# Patient Record
Sex: Female | Born: 1960 | Race: White | Hispanic: No | Smoking: Never smoker
Health system: Southern US, Community
[De-identification: ages and names within clinical notes are randomized; demographics above are authoritative.]

## PROBLEM LIST (undated history)

## (undated) DIAGNOSIS — E7800 Pure hypercholesterolemia, unspecified: Secondary | ICD-10-CM

## (undated) DIAGNOSIS — F419 Anxiety disorder, unspecified: Secondary | ICD-10-CM

## (undated) DIAGNOSIS — K219 Gastro-esophageal reflux disease without esophagitis: Secondary | ICD-10-CM

## (undated) DIAGNOSIS — F329 Major depressive disorder, single episode, unspecified: Secondary | ICD-10-CM

## (undated) DIAGNOSIS — K589 Irritable bowel syndrome without diarrhea: Secondary | ICD-10-CM

## (undated) DIAGNOSIS — I10 Essential (primary) hypertension: Secondary | ICD-10-CM

## (undated) DIAGNOSIS — F32A Depression, unspecified: Secondary | ICD-10-CM

## (undated) DIAGNOSIS — E119 Type 2 diabetes mellitus without complications: Secondary | ICD-10-CM

## (undated) HISTORY — PX: SHOULDER SURGERY: SHX246

## (undated) HISTORY — DX: Type 2 diabetes mellitus without complications: E11.9

## (undated) HISTORY — PX: TUBAL LIGATION: SHX77

## (undated) HISTORY — PX: URETHRA SURGERY: SHX824

---

## 2011-08-21 ENCOUNTER — Ambulatory Visit (HOSPITAL_COMMUNITY)
Admission: RE | Admit: 2011-08-21 | Discharge: 2011-08-21 | Disposition: A | Discharge status: Home / Self Care | Payer: BC Managed Care – PPO | Attending: Psychiatry | Admitting: Psychiatry

## 2011-08-21 ENCOUNTER — Encounter (HOSPITAL_COMMUNITY): Payer: Self-pay | Admitting: *Deleted

## 2011-08-21 DIAGNOSIS — K589 Irritable bowel syndrome without diarrhea: Secondary | ICD-10-CM | POA: Insufficient documentation

## 2011-08-21 DIAGNOSIS — F329 Major depressive disorder, single episode, unspecified: Secondary | ICD-10-CM | POA: Insufficient documentation

## 2011-08-21 DIAGNOSIS — I10 Essential (primary) hypertension: Secondary | ICD-10-CM | POA: Insufficient documentation

## 2011-08-21 DIAGNOSIS — E119 Type 2 diabetes mellitus without complications: Secondary | ICD-10-CM | POA: Insufficient documentation

## 2011-08-21 DIAGNOSIS — F3289 Other specified depressive episodes: Secondary | ICD-10-CM | POA: Insufficient documentation

## 2011-08-21 DIAGNOSIS — F17200 Nicotine dependence, unspecified, uncomplicated: Secondary | ICD-10-CM | POA: Insufficient documentation

## 2011-08-21 HISTORY — DX: Depression, unspecified: F32.A

## 2011-08-21 HISTORY — DX: Essential (primary) hypertension: I10

## 2011-08-21 HISTORY — DX: Irritable bowel syndrome, unspecified: K58.9

## 2011-08-21 HISTORY — DX: Anxiety disorder, unspecified: F41.9

## 2011-08-21 HISTORY — DX: Major depressive disorder, single episode, unspecified: F32.9
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
## 2011-08-22 ENCOUNTER — Encounter (HOSPITAL_COMMUNITY): Payer: Self-pay

## 2011-08-22 ENCOUNTER — Other Ambulatory Visit (HOSPITAL_COMMUNITY): Payer: BC Managed Care – PPO | Attending: Psychiatry

## 2011-08-22 DIAGNOSIS — K589 Irritable bowel syndrome without diarrhea: Secondary | ICD-10-CM | POA: Insufficient documentation

## 2011-08-22 DIAGNOSIS — F332 Major depressive disorder, recurrent severe without psychotic features: Secondary | ICD-10-CM | POA: Insufficient documentation

## 2011-08-22 DIAGNOSIS — F419 Anxiety disorder, unspecified: Secondary | ICD-10-CM

## 2011-08-22 DIAGNOSIS — F1010 Alcohol abuse, uncomplicated: Secondary | ICD-10-CM

## 2011-08-22 DIAGNOSIS — E119 Type 2 diabetes mellitus without complications: Secondary | ICD-10-CM | POA: Insufficient documentation

## 2011-08-22 DIAGNOSIS — I10 Essential (primary) hypertension: Secondary | ICD-10-CM | POA: Insufficient documentation

## 2011-08-22 DIAGNOSIS — F329 Major depressive disorder, single episode, unspecified: Secondary | ICD-10-CM
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
## 2011-08-22 NOTE — Progress Notes (Signed)
  Daily Group Progress Note  Program: IOP  Group Time: 0900 - 1030  Participation Level: Active  Behavioral Response: Appropriate, Sharing and Motivated  Type of Therapy:  Psycho-education Group  Summary of Progress:   Missed this part of program as was having initial interview.    Group Time: 1045 - 1210  Participation Level:  Active  Behavioral Response: Appropriate, Sharing and Motivated  Type of Therapy: Process Group  Summary of Progress:   Invited by group to share her story and she did this quite well, actually demonstrating the behavior that she said her son accused her of -- blaming others and not taking own responsibility for her part in the problems.  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  Then went on to blame son, daughter, and husband for her current depression. Interrupted her when she took on the role of therapist to peers and she accepted this well.  Shonna Chock, APRN, CNS [facility]

## 2011-08-23 ENCOUNTER — Other Ambulatory Visit (HOSPITAL_COMMUNITY): Payer: BC Managed Care – PPO

## 2011-08-23 MED ORDER — BUPROPION HCL 100 MG PO TABS: 100.0000 mg | ORAL_TABLET | Freq: Two times a day (BID) | ORAL | Status: AC

## 2011-08-23 NOTE — Progress Notes (Unsigned)
    Daily Group Progress Note  Program: IOP  Group Time: 9:00-10:30 am   Participation Level: Active  Behavioral Response: Appropriate  Type of Therapy:  Process Group  Summary of Progress: She was very talkative today and required redirection to allow others time to share. She expressed concern and confusion over her relationship with her teenage children and how they are distant from her. She lacks insight into how she is smothering them. She states her children are "my whole world" and how sad she is that they are pushing her away. She refers to herself as their "mommy" she also expressed sadness over transitioning from being employed to unemployed as of yesterday on her last day at work. She questions her current identity apart from being a mom and employee.      Group Time: 10:30 am - 12:00 pm   Participation Level:  Active  Behavioral Response: Appropriate  Type of Therapy: Process Group  Summary of Progress: Patient participated in a goodbye ceremony and discussed the importance of having closure when relationships end.   Maxcine Ham, MSW, LCSW

## 2011-08-24 ENCOUNTER — Other Ambulatory Visit (HOSPITAL_COMMUNITY): Payer: BC Managed Care – PPO

## 2011-08-24 NOTE — Progress Notes (Signed)
    Daily Group Progress Note  Program: IOP  Group Time: 9:00-10:30 am   Participation Level: Active  Behavioral Response: Appropriate  Type of Therapy:  Process Group  Summary of Progress: Patient is very talkative about herself and her situations and requires redirection to allow others time to share. She is easily redirected. She expresses a need to "have emotionally attention" from others and identified with a young group member, age 53, today and states she wanted to be her caretaker and asked if she could hug her. She was redirected from this behavior and challenged to see how she has a need to care take for people and receive emotional support from them since she is not getting it from her husband and children. She appears to have some co-dependent traits and is being challenged to find herself through this process. She stated later in group she was considering cutting herself last night in an attempt to get attention from her family. She lacks healthy self coping skills and appears to rely on others to feel good.      Group Time: 10:30 am - 12:00 pm   Participation Level:  Active  Behavioral Response: Appropriate  Type of Therapy: Psycho-education Group  Summary of Progress: Patient was educated on the diagnosis of depression vs anxiety and identified personal symptoms they experience in each one to be able to identify when symptoms return and better understand what they are being treated for.    Maxcine Ham, MSW, LCSW
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
## 2011-08-27 ENCOUNTER — Other Ambulatory Visit (HOSPITAL_COMMUNITY): Payer: BC Managed Care – PPO

## 2011-08-27 NOTE — Progress Notes (Signed)
    Daily Group Progress Note  Program: IOP  Group Time: 9:00-10:30 am   Participation Level: Active  Behavioral Response: Passive-Aggressive, Resistant, Disruptive, Blaming, Attention-Seeking, Monopolizing, Agitated and Evasive  Type of Therapy:  Process Group  Summary of Progress: Patient engaged in several negative and disruptive and unsupportive behaviors during the group. She left the room while making several loud distractions during a time of quiet medication and stated "I just wanted to step out". She made racist comments about the entire population of "cubans" and did not respond well when asked to speak only about herself and her husband instead of attacking an entire culture. She was defensive when members challenged her views and then became angry when she was redirected to allow others time to share. She talks of wanting to change her husband and her children to make them like her more, but lacks insight into how she needs to be the one to change. She does not engage with the other group members and while they talk and bond, she goes out into the hallway and takes personal calls. All the group members expressed "fear" of her and stated they don't feel comfortable with her in the group when patient left the room for break.      Group Time: 10:30 am - 12:00 pm   Participation Level:  Active  Behavioral Response: Rigid, Passive-Aggressive, Disruptive, Attention-Seeking, Monopolizing, Agitated and Intrusive  Type of Therapy: Psycho-education Group  Summary of Progress: Patient listened to an education segment on how to be assertive and gave examples of how she uses the aggressive style of communication in many interactions with others. She appeared to not find the topic helpful and stated she just "wants to talk about her own situation for the whole time" (her words). Writer met privately immediately following group with patient to express concerns about behaviors presented in  the group. Patient did not hear writers content and interjected feelings that were not being discusses. Patient stated "you just don't like me" when writer was trying to get patient to engage more with the group. Patient then stated "so its all my fault". Writer is observing some traits of Borderline personality within behaviors observed by patient so far in the group setting.   Maxcine Ham, MSW, LCSW

## 2011-08-28 ENCOUNTER — Other Ambulatory Visit (HOSPITAL_COMMUNITY): Payer: BC Managed Care – PPO

## 2011-08-29 ENCOUNTER — Other Ambulatory Visit (HOSPITAL_COMMUNITY): Payer: BC Managed Care – PPO

## 2011-08-29 NOTE — Progress Notes (Unsigned)
    Daily Group Progress Note  Program: IOP  Group Time: 9:00-10:30 am   Participation Level: Active  Behavioral Response: Appropriate  Type of Therapy:  Process Group  Summary of Progress: Patient presented completley different today and was open and receptive to being a member of the group, listening and accepting feedback. She really listened and summarized what she heard others saying and stated she is learning more about herself than she has ever learned before about how she is getting in the way of having heathy relationships with others. She has a lot of negative judgements that she places on others that required some redirection, but she was easily redirected. She would benefit from learning how to use the DBT skill of WISE mind to help her reduce her negative judgments of others.      Group Time: 10:30 am - 12:00 pm   Participation Level:  Active  Behavioral Response: Appropriate  Type of Therapy: Psycho-education Group  Summary of Progress: Patient participated in a continued education segment on Boundary setting and learned the five ways to implement boundaries with others.   10:30 am - 12:00 pm

## 2011-08-29 NOTE — Progress Notes (Signed)
    Daily Group Progress Note  Program: IOP  Group Time: 9:00-10:30 am   Participation Level: Active  Behavioral Response: Resistant, Attention-Seeking, Monopolizing, Agitated and Evasive  Type of Therapy:  Process Group  Summary of Progress: Patient left the room abruptly and with distraction for a second day in a row during the five minutes of quiet meditation. She continued to set herself apart from the group and members appears to be getting frustrated with her behavior. Members intervened by giving patient feedback on how her negative behaviors and tendency not to listen to them and make everything about her was pushing them away from her. Patient became defensive and was not hearing the real content of what was being shared with her. She was inserting her own feelings, assumptions and thoughts into the feedback others were giving her. It took almost forty five minutes of doing this direct intervention before patient was able to really  Listen and hear the concerns of each group member, but she was able to and spent the rest of the group practicing "good listening skills".       Group Time: 10:30 am - 12:00 pm   Participation Level:  Active  Behavioral Response: Appropriate  Type of Therapy: Psycho-education Group  Summary of Progress:   Patient participated in an education segment on defining healthy boundaries, barriers to setting healthy limits and how different personality style impact boundary setting.    Maxcine Ham, MSW, LCSW

## 2011-08-30 ENCOUNTER — Other Ambulatory Visit (HOSPITAL_COMMUNITY): Payer: BC Managed Care – PPO

## 2011-08-30 NOTE — Progress Notes (Signed)
    Daily Group Progress Note  Program: IOP  Group Time: 9:00-10:30 am   Participation Level: Active  Behavioral Response: Appropriate  Type of Therapy:  Process Group  Summary of Progress: Patient appeared more calm and in control of herself today and group members gave her this feedback. She expressed sadness over her teenage children not treating her with more respect, needing her as "mommy" and getting bad grades. She was challenged on how they are acting in an age appropriate way and her need to control them is not working for her and increasing her depression. She is working on accepting her situation. She also is expressing frustration with her husband and her marriage, but feels helpless to leave. She is forming with the group and responds very well to direct redirection.      Group Time: 10:30 am - 12:00 pm   Participation Level:  Active  Behavioral Response: Appropriate  Type of Therapy: Psycho-education Group  Summary of Progress: Patient participated in an education segment on self esteem and rated their current level of self esteem and identified events from childhood that impacted self-esteem level, unhealthy rules and behaviors associated with low self-esteem and ways to increase positive self image.    Maxcine Ham, MSW, LCSW

## 2011-08-30 NOTE — Patient Instructions (Signed)
Patient will complete MH-IOP tomorrow.  Follow up with Dr. Jeannine Kitten on 09-04-11 and Arloa Koh, [facility] the following week.

## 2011-08-31 ENCOUNTER — Other Ambulatory Visit (HOSPITAL_COMMUNITY): Payer: BC Managed Care – PPO

## 2011-08-31 ENCOUNTER — Other Ambulatory Visit (HOSPITAL_COMMUNITY): Payer: Self-pay | Admitting: Psychiatry

## 2011-08-31 ENCOUNTER — Ambulatory Visit (HOSPITAL_COMMUNITY)
Admission: AD | Admit: 2011-08-31 | Discharge: 2011-08-31 | Disposition: A | Discharge status: Home / Self Care | Payer: BC Managed Care – PPO | Source: Ambulatory Visit | Attending: Psychiatry | Admitting: Psychiatry

## 2011-08-31 DIAGNOSIS — E119 Type 2 diabetes mellitus without complications: Secondary | ICD-10-CM | POA: Insufficient documentation

## 2011-08-31 DIAGNOSIS — F603 Borderline personality disorder: Secondary | ICD-10-CM | POA: Insufficient documentation

## 2011-08-31 DIAGNOSIS — F329 Major depressive disorder, single episode, unspecified: Secondary | ICD-10-CM | POA: Insufficient documentation

## 2011-08-31 DIAGNOSIS — F17200 Nicotine dependence, unspecified, uncomplicated: Secondary | ICD-10-CM | POA: Insufficient documentation

## 2011-08-31 DIAGNOSIS — F3289 Other specified depressive episodes: Secondary | ICD-10-CM | POA: Insufficient documentation

## 2011-08-31 DIAGNOSIS — K589 Irritable bowel syndrome without diarrhea: Secondary | ICD-10-CM | POA: Insufficient documentation

## 2011-08-31 DIAGNOSIS — I10 Essential (primary) hypertension: Secondary | ICD-10-CM | POA: Insufficient documentation

## 2011-08-31 NOTE — Progress Notes (Unsigned)
    Daily Group Progress Note  Program: IOP  Group Time: 9:00-10:30 am   Participation Level: Active  Behavioral Response: Resistant, Disruptive, Monopolizing, Agitated and Intrusive  Type of Therapy:  Process Group  Summary of Progress: Patient resorted back to unhealthy, negative, attention seeking behaviors and was disruptive to the entire group process. She took personal cell phone calls during group, did not participate during the relaxation exercise and informed the discharging psychiatrist that yesterday she took four Klonopin, which was over her prescribed dose yesterday, because she "could not handle things"  She made vague suicidal comments to the doctor and during group. She was not listening to other group members and wanted to only talk about herself. She lacked insight into how she was engaging in negative behaviors. She said she felt she needed to go inpatient due to not being able to handle family stressors. Every group member expressed their fear for patient and agreed she needed to go into the hospital. Writer informed the attending psychiatrist, Dr. Lolly Mustache, of her agreement for voluntary inpatient admission and then walked patient upstairs and put her in the locked assessment room where she waited for the intake counselor. Writer was then informed that patient lied to hospital staff and told them she was a visitor and had then let her outside where she left without informing anyone she was leaving. She later called writer to inform her she was ok and went to get "a message". Writer left her a message informing her of her completed discharge and notified her to contact her therapist for continued treatment. It is writers recommendation that patient would benefit from continued DBT therapy to address unhealthy behaviors impacting her strained family relationships as this played out daily within the group process.     Group Time: 10:30 am - 12:00 pm   Participation Level:   None  Behavioral Response: None  Type of Therapy: None  Summary of Progress: see above note  Maxcine Ham, MSW, LCSW
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
## 2011-08-31 NOTE — Progress Notes (Unsigned)
Patient came today to attend her intensive outpatient program.  She was supposed to be discharged from the program today however patient endorsed increased anxiety and depression.  She has difficulty falling asleep last night and she took for Klonopin and despite that she is unable to sleep.  Patient endorse significant stress at home.  She had argument with her son.  She does not feel safe going back to her home.  Her husband is out of the town.  She admitted having strong suicidal thoughts but denies any plan however she also does not feel safe at home.  She denies any paranoia or any hallucination but admitted feeling of hopelessness helplessness and anhedonia.  Patient was offered voluntary inpatient admission.  Patient agreed with the plan.  Case discussed with the staff of intensive outpatient program and patient was escorted to assessment.

## 2011-09-03 ENCOUNTER — Other Ambulatory Visit (HOSPITAL_COMMUNITY): Payer: BC Managed Care – PPO

## 2011-09-04 ENCOUNTER — Other Ambulatory Visit (HOSPITAL_COMMUNITY): Payer: BC Managed Care – PPO

## 2011-09-05 ENCOUNTER — Other Ambulatory Visit (HOSPITAL_COMMUNITY): Payer: BC Managed Care – PPO

## 2011-09-06 ENCOUNTER — Other Ambulatory Visit (HOSPITAL_COMMUNITY): Payer: BC Managed Care – PPO

## 2011-09-07 ENCOUNTER — Other Ambulatory Visit (HOSPITAL_COMMUNITY): Payer: BC Managed Care – PPO

## 2011-09-10 ENCOUNTER — Other Ambulatory Visit (HOSPITAL_COMMUNITY): Payer: BC Managed Care – PPO

## 2011-09-11 ENCOUNTER — Other Ambulatory Visit (HOSPITAL_COMMUNITY): Payer: BC Managed Care – PPO

## 2011-09-12 ENCOUNTER — Other Ambulatory Visit (HOSPITAL_COMMUNITY): Payer: BC Managed Care – PPO

## 2011-09-13 ENCOUNTER — Other Ambulatory Visit (HOSPITAL_COMMUNITY): Payer: BC Managed Care – PPO

## 2011-09-14 ENCOUNTER — Other Ambulatory Visit (HOSPITAL_COMMUNITY): Payer: BC Managed Care – PPO

## 2011-09-18 ENCOUNTER — Other Ambulatory Visit (HOSPITAL_COMMUNITY): Payer: BC Managed Care – PPO

## 2011-09-19 ENCOUNTER — Other Ambulatory Visit (HOSPITAL_COMMUNITY): Payer: BC Managed Care – PPO

## 2014-04-29 ENCOUNTER — Encounter: Payer: Self-pay | Admitting: Podiatry

## 2014-04-29 ENCOUNTER — Ambulatory Visit: Payer: Self-pay | Admitting: Podiatry

## 2014-04-29 ENCOUNTER — Ambulatory Visit (INDEPENDENT_AMBULATORY_CARE_PROVIDER_SITE_OTHER): Payer: 59 | Admitting: Podiatry

## 2014-04-29 ENCOUNTER — Ambulatory Visit (INDEPENDENT_AMBULATORY_CARE_PROVIDER_SITE_OTHER): Payer: 59

## 2014-04-29 DIAGNOSIS — S92301A Fracture of unspecified metatarsal bone(s), right foot, initial encounter for closed fracture: Secondary | ICD-10-CM

## 2014-04-29 DIAGNOSIS — M779 Enthesopathy, unspecified: Secondary | ICD-10-CM

## 2014-04-29 MED ORDER — TRIAMCINOLONE ACETONIDE 10 MG/ML IJ SUSP
10.0000 mg | Freq: Once | INTRAMUSCULAR | Status: AC
  Administered 2014-04-29: 10 mg
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
## 2014-05-03 ENCOUNTER — Telehealth: Payer: Self-pay | Admitting: *Deleted
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
## 2014-05-05 ENCOUNTER — Telehealth: Payer: Self-pay

## 2014-05-05 ENCOUNTER — Telehealth: Payer: Self-pay | Admitting: *Deleted

## 2014-05-05 NOTE — Telephone Encounter (Signed)
Pt called stating that she needed her diagnosis explained to her further. I returned her call and left a voice mail for her to call the office.

## 2014-05-05 NOTE — Telephone Encounter (Signed)
Entered in error
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
## 2014-05-20 ENCOUNTER — Encounter: Payer: Self-pay | Admitting: Podiatry

## 2014-05-20 ENCOUNTER — Ambulatory Visit (INDEPENDENT_AMBULATORY_CARE_PROVIDER_SITE_OTHER): Payer: 59 | Admitting: Podiatry

## 2014-05-20 DIAGNOSIS — S92301A Fracture of unspecified metatarsal bone(s), right foot, initial encounter for closed fracture: Secondary | ICD-10-CM

## 2014-05-20 DIAGNOSIS — M779 Enthesopathy, unspecified: Secondary | ICD-10-CM
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
## 2014-10-28 ENCOUNTER — Emergency Department (HOSPITAL_COMMUNITY)
Admission: EM | Admit: 2014-10-28 | Discharge: 2014-10-28 | Disposition: A | Discharge status: Home / Self Care | Payer: 59 | Attending: Emergency Medicine | Admitting: Emergency Medicine

## 2014-10-28 ENCOUNTER — Encounter (HOSPITAL_COMMUNITY): Payer: Self-pay | Admitting: Emergency Medicine

## 2014-10-28 DIAGNOSIS — S161XXA Strain of muscle, fascia and tendon at neck level, initial encounter: Secondary | ICD-10-CM

## 2014-10-28 DIAGNOSIS — Y92410 Unspecified street and highway as the place of occurrence of the external cause: Secondary | ICD-10-CM | POA: Diagnosis not present

## 2014-10-28 DIAGNOSIS — R0602 Shortness of breath: Secondary | ICD-10-CM | POA: Insufficient documentation

## 2014-10-28 DIAGNOSIS — Z79899 Other long term (current) drug therapy: Secondary | ICD-10-CM | POA: Diagnosis not present

## 2014-10-28 DIAGNOSIS — Y999 Unspecified external cause status: Secondary | ICD-10-CM | POA: Diagnosis not present

## 2014-10-28 DIAGNOSIS — I10 Essential (primary) hypertension: Secondary | ICD-10-CM | POA: Insufficient documentation

## 2014-10-28 DIAGNOSIS — Z791 Long term (current) use of non-steroidal anti-inflammatories (NSAID): Secondary | ICD-10-CM | POA: Diagnosis not present

## 2014-10-28 DIAGNOSIS — E119 Type 2 diabetes mellitus without complications: Secondary | ICD-10-CM | POA: Diagnosis not present

## 2014-10-28 DIAGNOSIS — Z8719 Personal history of other diseases of the digestive system: Secondary | ICD-10-CM | POA: Diagnosis not present

## 2014-10-28 DIAGNOSIS — F329 Major depressive disorder, single episode, unspecified: Secondary | ICD-10-CM | POA: Insufficient documentation

## 2014-10-28 DIAGNOSIS — F419 Anxiety disorder, unspecified: Secondary | ICD-10-CM | POA: Insufficient documentation

## 2014-10-28 DIAGNOSIS — S199XXA Unspecified injury of neck, initial encounter: Secondary | ICD-10-CM | POA: Diagnosis present

## 2014-10-28 DIAGNOSIS — Y9389 Activity, other specified: Secondary | ICD-10-CM | POA: Insufficient documentation
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
## 2014-10-28 NOTE — ED Notes (Signed)
MD at bedside. 
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
## 2014-10-28 NOTE — ED Notes (Signed)
Patient repeatedly asking to see a priest. Patient was offered a Orthoptistchaplain, she states she wants a priest but will speak to a chaplain. PA entered patient room, patient states to the PA "you will have to come back I am trying to get a hold of my priest."   Patient ambulatory to restroom unassisted. Patient with bottle of water in the bed, patient was previously advised, and again reminded she is not to eat or drink while waiting for a physician and testing to be completed.
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
## 2017-02-08 ENCOUNTER — Other Ambulatory Visit (HOSPITAL_BASED_OUTPATIENT_CLINIC_OR_DEPARTMENT_OTHER): Payer: Self-pay | Admitting: Family Medicine

## 2017-02-08 DIAGNOSIS — Z1231 Encounter for screening mammogram for malignant neoplasm of breast: Secondary | ICD-10-CM

## 2017-02-25 ENCOUNTER — Encounter (HOSPITAL_BASED_OUTPATIENT_CLINIC_OR_DEPARTMENT_OTHER): Payer: Self-pay

## 2017-02-25 ENCOUNTER — Ambulatory Visit (HOSPITAL_BASED_OUTPATIENT_CLINIC_OR_DEPARTMENT_OTHER)
Admission: RE | Admit: 2017-02-25 | Discharge: 2017-02-25 | Disposition: A | Discharge status: Home / Self Care | Payer: Medicare Other | Source: Ambulatory Visit | Attending: Family Medicine | Admitting: Family Medicine

## 2017-02-25 DIAGNOSIS — Z1231 Encounter for screening mammogram for malignant neoplasm of breast: Secondary | ICD-10-CM | POA: Diagnosis present

## 2018-01-14 ENCOUNTER — Other Ambulatory Visit: Payer: Self-pay

## 2018-01-14 ENCOUNTER — Emergency Department (HOSPITAL_BASED_OUTPATIENT_CLINIC_OR_DEPARTMENT_OTHER)
Admission: EM | Admit: 2018-01-14 | Discharge: 2018-01-15 | Disposition: A | Discharge status: Home / Self Care | Payer: Medicare HMO | Attending: Emergency Medicine | Admitting: Emergency Medicine

## 2018-01-14 ENCOUNTER — Encounter (HOSPITAL_BASED_OUTPATIENT_CLINIC_OR_DEPARTMENT_OTHER): Payer: Self-pay | Admitting: *Deleted

## 2018-01-14 DIAGNOSIS — I10 Essential (primary) hypertension: Secondary | ICD-10-CM | POA: Insufficient documentation

## 2018-01-14 DIAGNOSIS — Z7984 Long term (current) use of oral hypoglycemic drugs: Secondary | ICD-10-CM | POA: Insufficient documentation

## 2018-01-14 DIAGNOSIS — E119 Type 2 diabetes mellitus without complications: Secondary | ICD-10-CM | POA: Insufficient documentation

## 2018-01-14 DIAGNOSIS — R799 Abnormal finding of blood chemistry, unspecified: Secondary | ICD-10-CM | POA: Diagnosis present

## 2018-01-14 DIAGNOSIS — Z79899 Other long term (current) drug therapy: Secondary | ICD-10-CM | POA: Diagnosis not present

## 2018-01-14 DIAGNOSIS — N179 Acute kidney failure, unspecified: Secondary | ICD-10-CM | POA: Diagnosis not present

## 2018-01-14 LAB — URINALYSIS, ROUTINE W REFLEX MICROSCOPIC
BILIRUBIN URINE: NEGATIVE
Glucose, UA: NEGATIVE mg/dL
HGB URINE DIPSTICK: NEGATIVE
KETONES UR: NEGATIVE mg/dL
LEUKOCYTES UA: NEGATIVE
Nitrite: NEGATIVE
PH: 5.5 (ref 5.0–8.0)
PROTEIN: NEGATIVE mg/dL
Specific Gravity, Urine: 1.01 (ref 1.005–1.030)

## 2018-01-14 LAB — COMPREHENSIVE METABOLIC PANEL
ALBUMIN: 3.3 g/dL — AB (ref 3.5–5.0)
ALK PHOS: 61 U/L (ref 38–126)
ALT: 19 U/L (ref 0–44)
ANION GAP: 11 (ref 5–15)
AST: 20 U/L (ref 15–41)
BILIRUBIN TOTAL: 0.5 mg/dL (ref 0.3–1.2)
BUN: 18 mg/dL (ref 6–20)
CO2: 21 mmol/L — AB (ref 22–32)
CREATININE: 1.2 mg/dL — AB (ref 0.44–1.00)
Calcium: 8.6 mg/dL — ABNORMAL LOW (ref 8.9–10.3)
Chloride: 107 mmol/L (ref 98–111)
GFR calc Af Amer: 56 mL/min — ABNORMAL LOW (ref >60)
GFR calc non Af Amer: 48 mL/min — ABNORMAL LOW (ref >60)
Glucose, Bld: 121 mg/dL — ABNORMAL HIGH (ref 70–99)
Potassium: 3.9 mmol/L (ref 3.5–5.1)
SODIUM: 139 mmol/L (ref 135–145)
TOTAL PROTEIN: 6 g/dL — AB (ref 6.5–8.1)

## 2018-01-14 MED ORDER — SODIUM CHLORIDE 0.9 % IV BOLUS
1000.0000 mL | Freq: Once | INTRAVENOUS | Status: AC
  Administered 2018-01-14: 1000 mL via INTRAVENOUS

## 2018-01-14 NOTE — ED Triage Notes (Signed)
Her MD called her and told her she had abnormal lab results from blood drawn today. Increased BM's.

## 2018-01-14 NOTE — ED Provider Notes (Signed)
[facility] EMERGENCY DEPARTMENT Provider Note   CSN: [id number] Arrival date & time: 01/14/18  2241     History   Chief Complaint Chief Complaint  Patient presents with  . Abnormal Lab    HPI Alicia Callahan is a 57 y.o. female.  The history is provided by the patient.  She has history of diabetes, hypertension, anxiety, depression and was told to come into the ED because of an abnormal blood test.  She had diarrhea 2 days ago which has been mainly improved.  She had one loose bowel movement yesterday and one loose bowel movement today.  She was having generalized malaise and went to see her physician today.  Physician drew some blood work and found that her kidney function had gotten significantly worse and was told to come to the ED.  She denies fever or chills.  She has been taking ibuprofen and Goody powder.  She is also taking lisinopril for protection of her kidneys because of diabetes.  Past Medical History:  Diagnosis Date  . Anxiety   . Depression   . Diabetes mellitus   . Diabetes mellitus type II   . Hypertension   . Irritable bowel syndrome     There are no active problems to display for this patient.   Past Surgical History:  Procedure Laterality Date  . SHOULDER SURGERY Right   . TUBAL LIGATION       OB History   None      Home Medications    Prior to Admission medications   Medication Sig Start Date End Date Taking? Authorizing Provider  buPROPion (WELLBUTRIN) 100 MG tablet Take 1 tablet (100 mg total) by mouth 2 (two) times daily. Patient taking differently: Take 100 mg by mouth daily.  08/23/11 01/14/18 Yes Gayland Curry, MD  clonazePAM (KLONOPIN) 1 MG tablet Take 1 mg by mouth 2 (two) times daily as needed for anxiety.    Yes [provider]  lisinopril (PRINIVIL,ZESTRIL) 5 MG tablet Take 5 mg by mouth daily. 10/04/14 01/14/18 Yes [provider]  metFORMIN (GLUCOPHAGE) 500 MG tablet Take 1 tablet by mouth 2 (two)  times daily. 06/21/14  Yes [provider]  simvastatin (ZOCOR) 10 MG tablet Take 10 mg by mouth at bedtime. 10/06/14 01/14/18 Yes [provider]  vitamin E 100 UNIT capsule Take 100 Units by mouth daily.   Yes [provider]  zolpidem (AMBIEN) 5 MG tablet Take 5 mg by mouth at bedtime as needed for sleep.    Yes [provider]  Acetaminophen-Caffeine (TENSION HEADACHE) 500-65 MG TABS Take 2 tablets by mouth every 4 (four) hours as needed (pain).    [provider]  Escitalopram Oxalate (LEXAPRO PO) Take 20 mg by mouth daily.     [provider]  gabapentin (NEURONTIN) 100 MG capsule Take 1-3 capsules by mouth at bedtime as needed. pain 08/02/14   [provider]  Ginkgo Biloba Extract (GNP GINGKO BILOBA EXTRACT) 60 MG CAPS Take 1 capsule by mouth daily.    [provider]  HYDROcodone-acetaminophen (NORCO/VICODIN) 5-325 MG per tablet Take 1 tablet by mouth every 8 (eight) hours as needed. pain 10/26/14   [provider]  hyoscyamine (ANASPAZ) 0.125 MG TBDP Place 0.125 mg under the tongue.    [provider]  metoprolol succinate (TOPROL-XL) 50 MG 24 hr tablet Take 1 tablet by mouth every morning. 10/18/14   [provider]  pantoprazole (PROTONIX) 40 MG tablet Take 40 mg  by mouth daily. 10/04/14 10/04/15  [provider]    Family History Family History  Problem Relation Age of Onset  . Depression Mother   . Alcohol abuse Father   . Alcohol abuse Brother   . Drug abuse Brother   . Depression Maternal Grandmother     Social History Social History   Tobacco Use  . Smoking status: Never Smoker  . Smokeless tobacco: Never Used  Substance Use Topics  . Alcohol use: Yes    Alcohol/week: 28.0 standard drinks    Types: 14 Glasses of wine, 14 Cans of beer per week    Comment: reports last drink 10/25/2014  . Drug use: No     Allergies   Sulphadimidine [sulfamethazine] and Sulphur  [sulfur]   Review of Systems Review of Systems  All other systems reviewed and are negative.    Physical Exam Updated Vital Signs BP 108/66 (BP Location: Right Arm)   Pulse 82   Temp 98.4 F (36.9 C) (Oral)   Resp 16   Ht 5\' 3"  (1.6 m)   Wt 64.4 kg   SpO2 98%   BMI 25.15 kg/m   Physical Exam  Nursing note and vitals reviewed.  57 year old female, resting comfortably and in no acute distress. Vital signs are normal. Oxygen saturation is 98%, which is normal. Head is normocephalic and atraumatic. PERRLA, EOMI. Oropharynx is clear. Neck is nontender and supple without adenopathy or JVD. Back is nontender and there is no CVA tenderness. Lungs are clear without rales, wheezes, or rhonchi. Chest is nontender. Heart has regular rate and rhythm without murmur. Abdomen is soft, flat, nontender without masses or hepatosplenomegaly and peristalsis is normoactive. Extremities have no cyanosis or edema, full range of motion is present. Skin is warm and dry without rash. Neurologic: Mental status is normal, cranial nerves are intact, there are no motor or sensory deficits.  ED Treatments / Results  Labs (all labs ordered are listed, but only abnormal results are displayed) Labs Reviewed  COMPREHENSIVE METABOLIC PANEL - Abnormal; Notable for the following components:      Result Value   CO2 21 (*)    Glucose, Bld 121 (*)    Creatinine, Ser 1.20 (*)    Calcium 8.6 (*)    Total Protein 6.0 (*)    Albumin 3.3 (*)    GFR calc non Af Amer 48 (*)    GFR calc Af Amer 56 (*)    All other components within normal limits  URINALYSIS, ROUTINE W REFLEX MICROSCOPIC  CBC WITH DIFFERENTIAL/PLATELET   Procedures Procedures   Medications Ordered in ED Medications  sodium chloride 0.9 % bolus 1,000 mL (1,000 mLs Intravenous New Bag/Given 01/14/18 2326)     Initial Impression / Assessment and Plan / ED Course  I have reviewed the triage vital signs and the nursing notes.  Pertinent  lab results that were available during my care of the patient were reviewed by me and considered in my medical decision making (see chart for details).  Elevated creatinine level.  Old records are reviewed, and labs drawn at her physician's office showed creatinine 1.56 with BUN 19, estimated GFR 36.  In April, creatinine had been 0.77 with estimated GFR 85, BUN 19.  I suspect that patient had some relative dehydration secondary to her diarrhea.  She also has been on NSAIDs which can cause renal failure.  She is also taking an ACE inhibitor which can cause renal failure.  Today, urinalysis shows normal  specific gravity.  Metabolic panel here shows creatinine 1.20, BUN 18, estimated GFR 56.  She is given an intravenous bolus of normal saline and is discharged with instructions to avoid all NSAIDs, stop lisinopril, have creatinine rechecked in 1 week through her primary care physician's office.  Final Clinical Impressions(s) / ED Diagnoses   Final diagnoses:  Acute kidney injury (nontraumatic) [facility])    ED Discharge Orders    None       Dione Booze, MD 01/15/18 0011

## 2018-01-15 LAB — CBC WITH DIFFERENTIAL/PLATELET
Abs Immature Granulocytes: 0 10*3/uL (ref 0.0–0.1)
BASOS PCT: 1 %
Basophils Absolute: 0.1 10*3/uL (ref 0.0–0.1)
Eosinophils Absolute: 0.8 10*3/uL — ABNORMAL HIGH (ref 0.0–0.7)
Eosinophils Relative: 9 %
HCT: 39.1 % (ref 36.0–46.0)
HEMOGLOBIN: 12.8 g/dL (ref 12.0–15.0)
Immature Granulocytes: 0 %
LYMPHS PCT: 39 %
Lymphs Abs: 3.7 10*3/uL (ref 0.7–4.0)
MCH: 31.1 pg (ref 26.0–34.0)
MCHC: 32.7 g/dL (ref 30.0–36.0)
MCV: 95.1 fL (ref 78.0–100.0)
Monocytes Absolute: 0.8 10*3/uL (ref 0.1–1.0)
Monocytes Relative: 8 %
NEUTROS ABS: 4.1 10*3/uL (ref 1.7–7.7)
Neutrophils Relative %: 43 %
Platelets: 298 10*3/uL (ref 150–400)
RBC: 4.11 MIL/uL (ref 3.87–5.11)
RDW: 12.1 % (ref 11.5–15.5)
WBC: 9.5 10*3/uL (ref 4.0–10.5)

## 2018-01-15 NOTE — Discharge Instructions (Addendum)
Drink plenty of fluids.  Do not take ibuprofen, naproxen, BC Powder, or anything that contains aspirin.  Stop taking lisinopril. Once your kidneys get back to normal, your doctor may choose to restart this.

## 2018-01-29 IMAGING — MG 2D DIGITAL SCREENING BILATERAL MAMMOGRAM WITH CAD AND ADJUNCT TO
9 series · 9 of 25 positions shown · non-contrast
Comparison: Previous exam(s).

CLINICAL DATA: Screening.

EXAM:
2D DIGITAL SCREENING BILATERAL MAMMOGRAM WITH CAD AND ADJUNCT TOMO

[R XCCL]
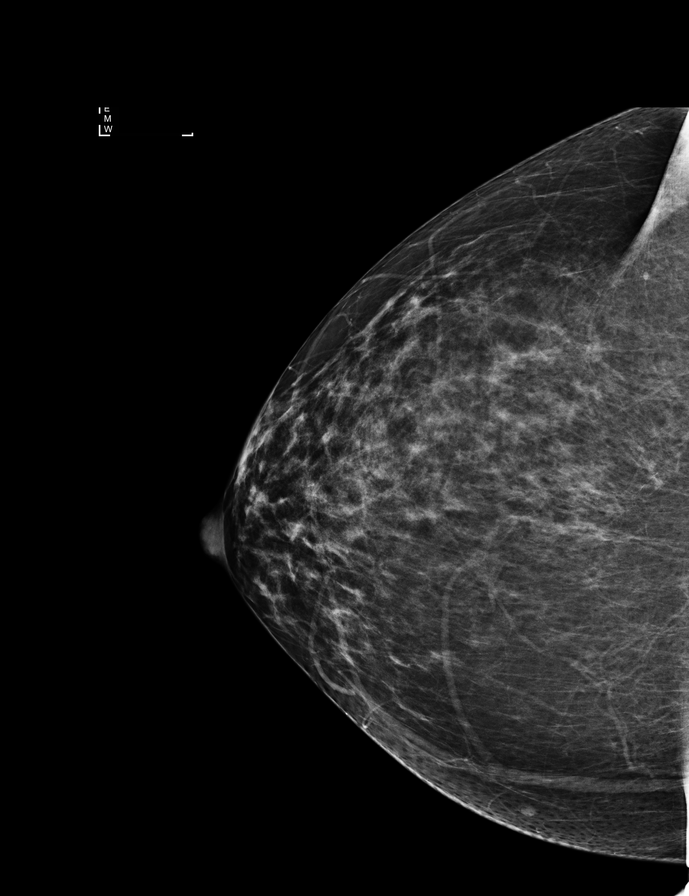

[R CC]
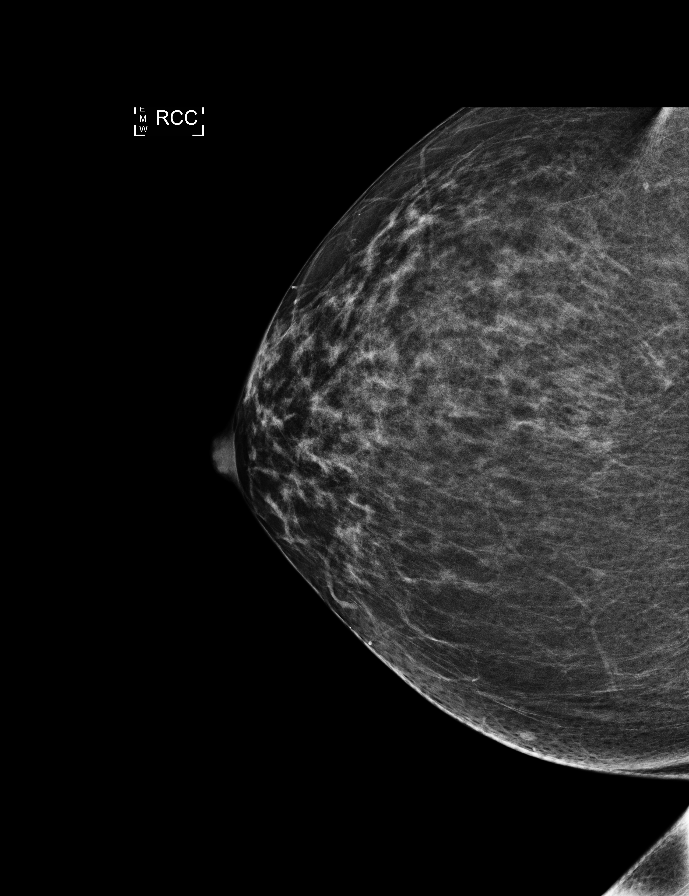

[L CC]
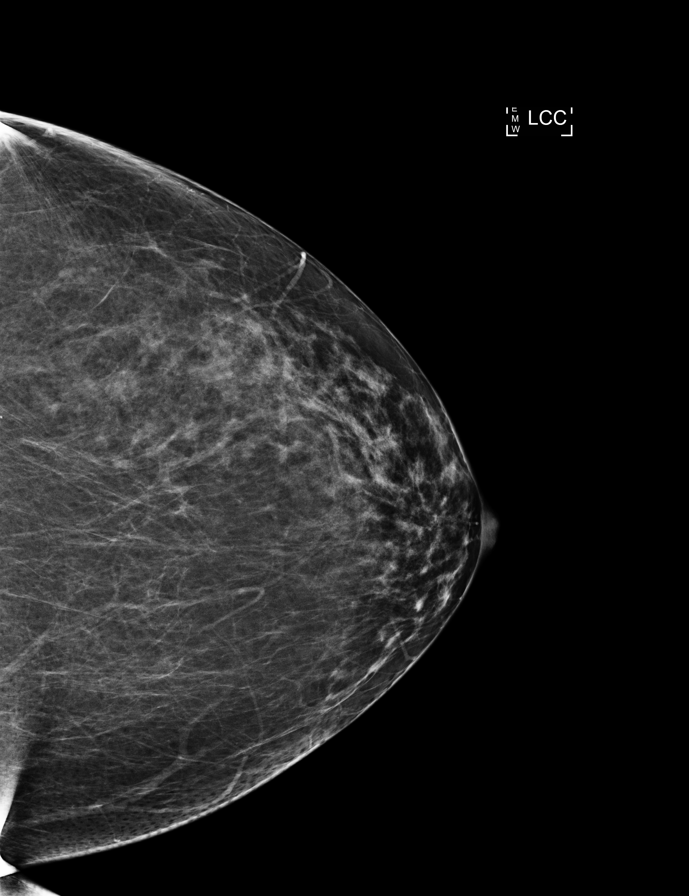

[R MLO]
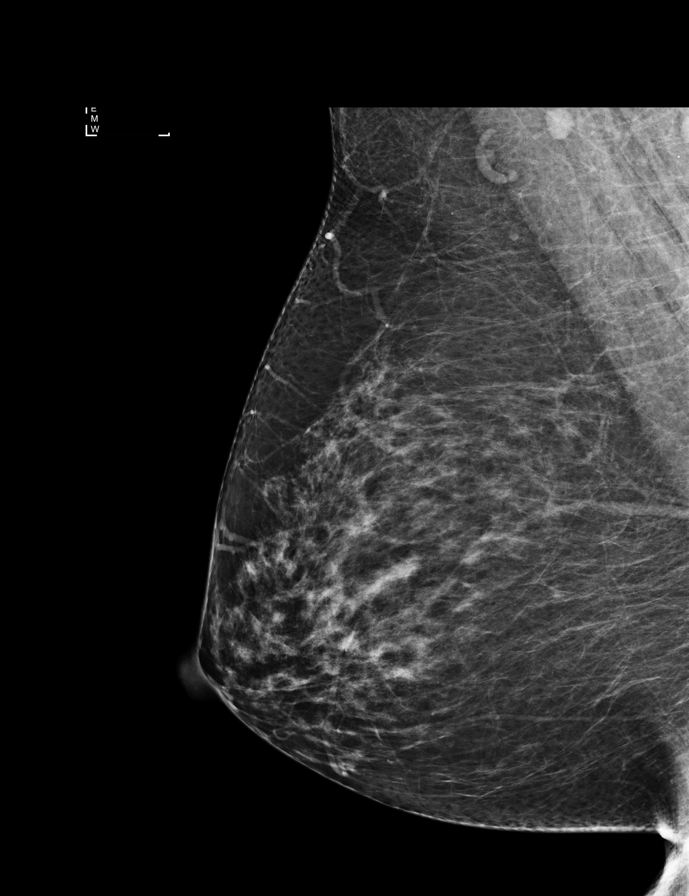

[L MLO]
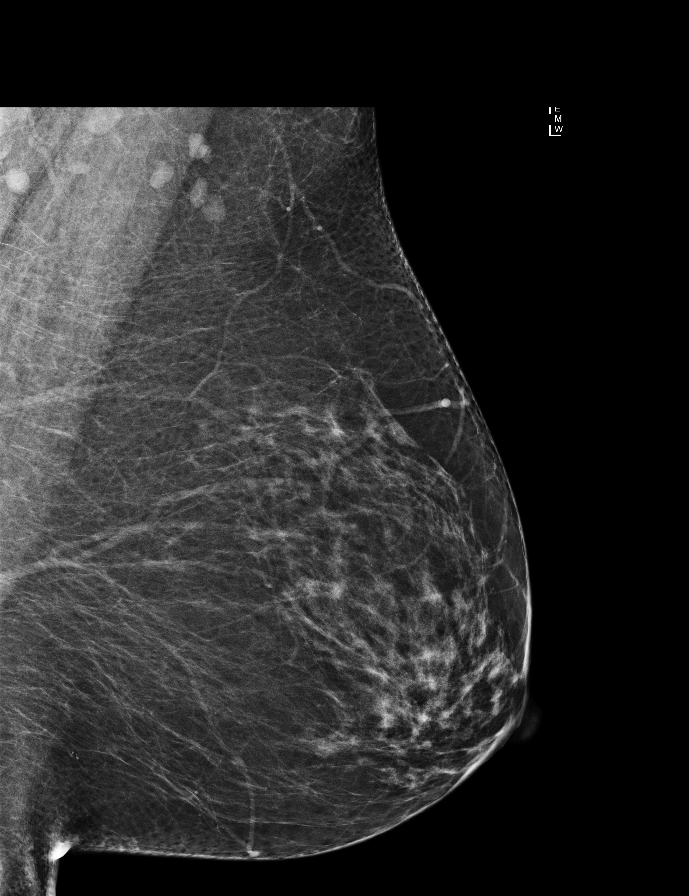

[L CC tomo · tomo slice 35/70.0]
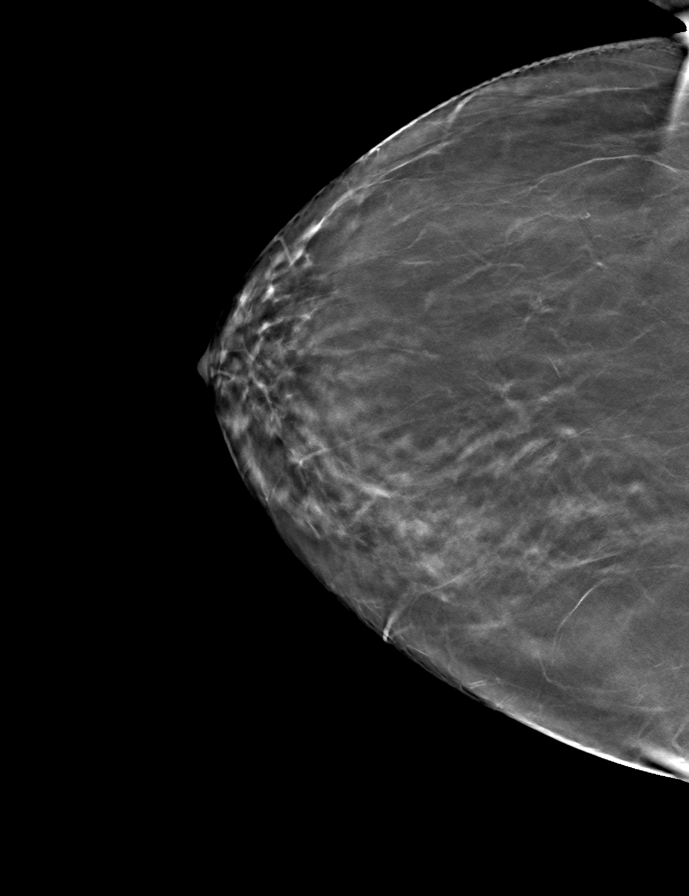

[R CC tomo · tomo slice 33/66.0]
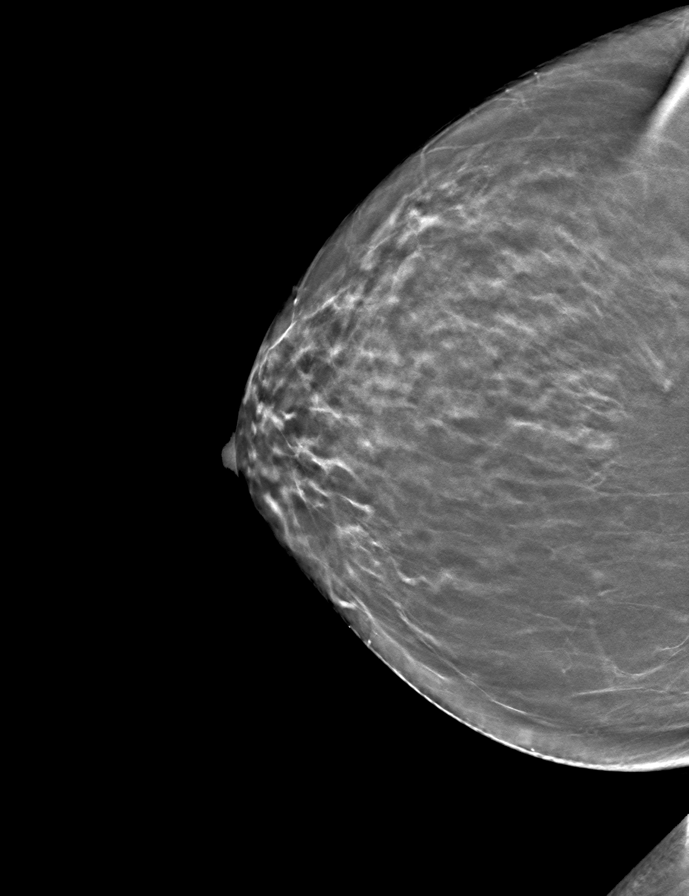

[R MLO tomo · tomo slice 41/80.0]
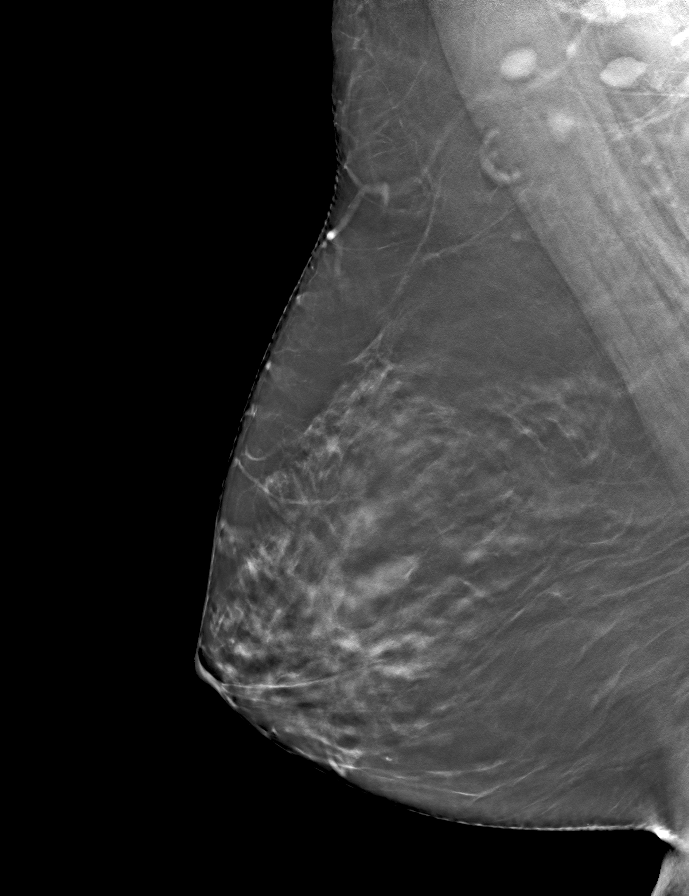

[L MLO tomo · tomo slice 40/79.0]
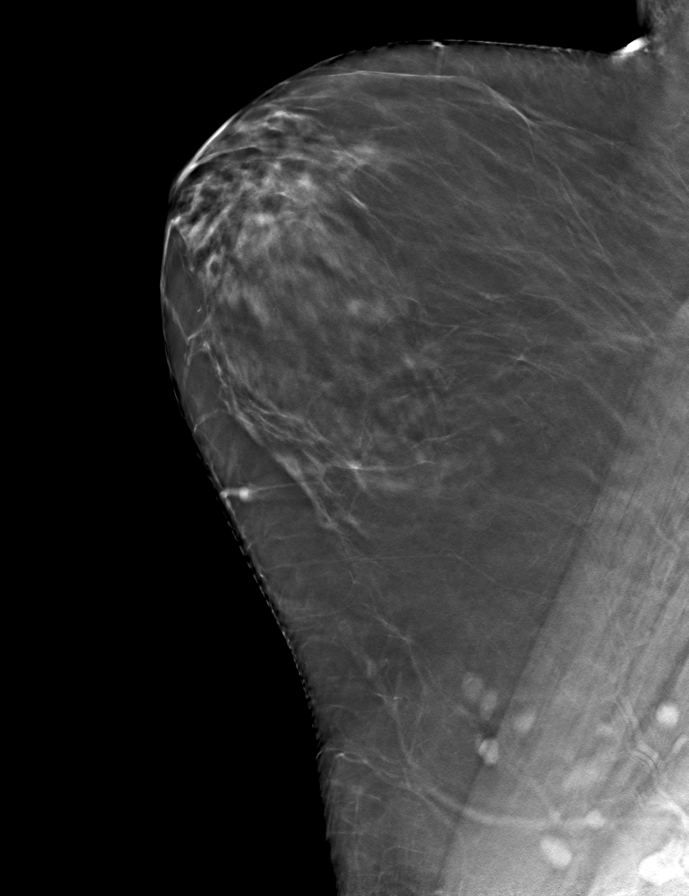

[9 of 25 positions shown; findings below may reference images not displayed]

ACR Breast Density Category b: There are scattered areas of
fibroglandular density.
FINDINGS: There are no findings suspicious for malignancy. Images were
processed with CAD.
IMPRESSION: No mammographic evidence of malignancy. A result letter of this
screening mammogram will be mailed directly to the patient.

RECOMMENDATION:
Screening mammogram in one year. (Code:97-6-RS4)

BI-RADS CATEGORY  1: Negative.

## 2020-04-22 ENCOUNTER — Other Ambulatory Visit: Payer: Self-pay

## 2020-04-22 ENCOUNTER — Emergency Department (HOSPITAL_BASED_OUTPATIENT_CLINIC_OR_DEPARTMENT_OTHER)
Admission: EM | Admit: 2020-04-22 | Discharge: 2020-04-22 | Discharge status: Against Medical Advice | Payer: Medicare HMO

## 2020-04-22 NOTE — ED Notes (Signed)
Pt called in bistro area, waiting room and restrooms, no answer.

## 2020-04-22 NOTE — ED Notes (Signed)
Pt called in waiting area, restrooms and bistro area. No answer.

## 2021-07-08 ENCOUNTER — Encounter (HOSPITAL_BASED_OUTPATIENT_CLINIC_OR_DEPARTMENT_OTHER): Payer: Self-pay | Admitting: *Deleted

## 2021-07-08 ENCOUNTER — Emergency Department (HOSPITAL_BASED_OUTPATIENT_CLINIC_OR_DEPARTMENT_OTHER): Payer: Medicare HMO

## 2021-07-08 ENCOUNTER — Emergency Department (HOSPITAL_BASED_OUTPATIENT_CLINIC_OR_DEPARTMENT_OTHER)
Admission: EM | Admit: 2021-07-08 | Discharge: 2021-07-08 | Disposition: A | Discharge status: Home / Self Care | Payer: Medicare HMO | Attending: Student | Admitting: Student

## 2021-07-08 ENCOUNTER — Other Ambulatory Visit: Payer: Self-pay

## 2021-07-08 DIAGNOSIS — K5900 Constipation, unspecified: Secondary | ICD-10-CM | POA: Diagnosis not present

## 2021-07-08 DIAGNOSIS — I10 Essential (primary) hypertension: Secondary | ICD-10-CM | POA: Insufficient documentation

## 2021-07-08 DIAGNOSIS — Z7984 Long term (current) use of oral hypoglycemic drugs: Secondary | ICD-10-CM | POA: Insufficient documentation

## 2021-07-08 DIAGNOSIS — E871 Hypo-osmolality and hyponatremia: Secondary | ICD-10-CM | POA: Insufficient documentation

## 2021-07-08 DIAGNOSIS — N12 Tubulo-interstitial nephritis, not specified as acute or chronic: Secondary | ICD-10-CM | POA: Insufficient documentation

## 2021-07-08 DIAGNOSIS — E119 Type 2 diabetes mellitus without complications: Secondary | ICD-10-CM | POA: Insufficient documentation

## 2021-07-08 DIAGNOSIS — R1011 Right upper quadrant pain: Secondary | ICD-10-CM | POA: Diagnosis present

## 2021-07-08 DIAGNOSIS — Z79899 Other long term (current) drug therapy: Secondary | ICD-10-CM | POA: Insufficient documentation

## 2021-07-08 DIAGNOSIS — D72829 Elevated white blood cell count, unspecified: Secondary | ICD-10-CM | POA: Diagnosis not present

## 2021-07-08 DIAGNOSIS — E876 Hypokalemia: Secondary | ICD-10-CM | POA: Diagnosis not present

## 2021-07-08 HISTORY — DX: Pure hypercholesterolemia, unspecified: E78.00

## 2021-07-08 HISTORY — DX: Gastro-esophageal reflux disease without esophagitis: K21.9

## 2021-07-08 LAB — COMPREHENSIVE METABOLIC PANEL
ALT: 11 U/L (ref 0–44)
AST: 15 U/L (ref 15–41)
Albumin: 3.1 g/dL — ABNORMAL LOW (ref 3.5–5.0)
Alkaline Phosphatase: 52 U/L (ref 38–126)
Anion gap: 8 (ref 5–15)
BUN: 10 mg/dL (ref 8–23)
CO2: 28 mmol/L (ref 22–32)
Calcium: 8.1 mg/dL — ABNORMAL LOW (ref 8.9–10.3)
Chloride: 90 mmol/L — ABNORMAL LOW (ref 98–111)
Creatinine, Ser: 0.65 mg/dL (ref 0.44–1.00)
GFR, Estimated: >60 mL/min (ref >60)
Glucose, Bld: 149 mg/dL — ABNORMAL HIGH (ref 70–99)
Potassium: 3.1 mmol/L — ABNORMAL LOW (ref 3.5–5.1)
Sodium: 126 mmol/L — ABNORMAL LOW (ref 135–145)
Total Bilirubin: 0.5 mg/dL (ref 0.3–1.2)
Total Protein: 6.3 g/dL — ABNORMAL LOW (ref 6.5–8.1)

## 2021-07-08 LAB — URINALYSIS, ROUTINE W REFLEX MICROSCOPIC
Bilirubin Urine: NEGATIVE
Glucose, UA: NEGATIVE mg/dL
Hgb urine dipstick: NEGATIVE
Ketones, ur: NEGATIVE mg/dL
Nitrite: NEGATIVE
Protein, ur: NEGATIVE mg/dL
Specific Gravity, Urine: 1.015 (ref 1.005–1.030)
pH: 8.5 — ABNORMAL HIGH (ref 5.0–8.0)

## 2021-07-08 LAB — CBC WITH DIFFERENTIAL/PLATELET
Abs Immature Granulocytes: 0.03 10*3/uL (ref 0.00–0.07)
Basophils Absolute: 0.1 10*3/uL (ref 0.0–0.1)
Basophils Relative: 1 %
Eosinophils Absolute: 0.1 10*3/uL (ref 0.0–0.5)
Eosinophils Relative: 1 %
HCT: 32.8 % — ABNORMAL LOW (ref 36.0–46.0)
Hemoglobin: 11.2 g/dL — ABNORMAL LOW (ref 12.0–15.0)
Immature Granulocytes: 0 %
Lymphocytes Relative: 20 %
Lymphs Abs: 2.2 10*3/uL (ref 0.7–4.0)
MCH: 30.5 pg (ref 26.0–34.0)
MCHC: 34.1 g/dL (ref 30.0–36.0)
MCV: 89.4 fL (ref 80.0–100.0)
Monocytes Absolute: 0.9 10*3/uL (ref 0.1–1.0)
Monocytes Relative: 9 %
Neutro Abs: 7.4 10*3/uL (ref 1.7–7.7)
Neutrophils Relative %: 69 %
Platelets: 343 10*3/uL (ref 150–400)
RBC: 3.67 MIL/uL — ABNORMAL LOW (ref 3.87–5.11)
RDW: 12.6 % (ref 11.5–15.5)
WBC: 10.7 10*3/uL — ABNORMAL HIGH (ref 4.0–10.5)
nRBC: 0 % (ref 0.0–0.2)

## 2021-07-08 LAB — URINALYSIS, MICROSCOPIC (REFLEX)

## 2021-07-08 MED ORDER — SODIUM CHLORIDE 0.9 % IV BOLUS
1000.0000 mL | Freq: Once | INTRAVENOUS | Status: AC
  Administered 2021-07-08: 1000 mL via INTRAVENOUS

## 2021-07-08 MED ORDER — POTASSIUM CHLORIDE CRYS ER 20 MEQ PO TBCR: 20.0000 meq | EXTENDED_RELEASE_TABLET | Freq: Two times a day (BID) | ORAL | 0 refills | Status: AC

## 2021-07-08 MED ORDER — IOHEXOL 350 MG/ML SOLN
100.0000 mL | Freq: Once | INTRAVENOUS | Status: DC | PRN
Start: 2021-07-08 — End: 2021-07-08

## 2021-07-08 MED ORDER — IOHEXOL 300 MG/ML  SOLN
100.0000 mL | Freq: Once | INTRAMUSCULAR | Status: AC | PRN
  Administered 2021-07-08: 100 mL via INTRAVENOUS

## 2021-07-08 MED ORDER — CEPHALEXIN 500 MG PO CAPS: 500.0000 mg | ORAL_CAPSULE | Freq: Four times a day (QID) | ORAL | 0 refills | Status: DC

## 2021-07-08 MED ORDER — CEPHALEXIN 500 MG PO CAPS: 500.0000 mg | ORAL_CAPSULE | Freq: Four times a day (QID) | ORAL | 0 refills | Status: AC

## 2021-07-08 MED ORDER — CEPHALEXIN 250 MG PO CAPS
500.0000 mg | ORAL_CAPSULE | Freq: Once | ORAL | Status: AC
  Administered 2021-07-08: 500 mg via ORAL
  Filled 2021-07-08: qty 2

## 2021-07-08 NOTE — ED Notes (Signed)
Patient ambulated to the bathroom without assistance.

## 2021-07-08 NOTE — ED Triage Notes (Signed)
Pt reports right side abdominal pain/flank pain x 1 week. She was evaluated at Urgent Care this morning and had labs and abdominal xray. Pt states they advised her to return to ED for [place] of gall bladder if Sx got worse. C/o nausea ?

## 2021-07-08 NOTE — ED Notes (Signed)
Lipase ?Specimen:  Blood ? Ref Range & Units Today  ?Lipase 11 - 82 U/L 44   ?Resulting Agency  [place] [facility]  ?Specimen Collected: 07/08/21 13:22 Last Resulted: 07/08/21 14:46  ?Received From: [facility]  Result Received: 07/08/21 18:52  ?  ? ?

## 2021-07-08 NOTE — Discharge Instructions (Addendum)
You were seen in emergency department today for abdominal pain. ? ?As we discussed your symptoms are related to a kidney infection.  I am prescribing you antibiotics for this. We have given you one dose here. It is important you finish the entire course of this.  ? ?Your potassium was also a little low. I'm prescribing you some oral replacement to take for the next several days.  ? ?You may do a Miralax bowel cleanout for relief which includes 8 capfulls of Miralax in Gatorade. Drink this in 2 hours on a day where you don't have plans and expect significant bowel movements throughout the day ending in liquid stools. You may continue forward with 1 capfull of Miralax twice a day if difficulties with bowel movements continue.   ? ?Continue to monitor how you're doing and return to the ER for new or worsening symptoms.  ?

## 2021-07-08 NOTE — ED Notes (Signed)
CBC and Differential ?Specimen:  Blood ? Ref Range & Units Today  ?WBC 4.4 - 11.0 x 10*3/uL 12.6 High    ?RBC 4.10 - 5.10 x 10*6/uL 3.96 Low    ?Hemoglobin 12.3 - 15.3 G/DL 74.2 Low    ?Hematocrit 35.9 - 44.6 % 35.8 Low    ?MCV 80.0 - 96.0 FL 90.4   ?MCH 27.5 - 33.2 PG 30.3   ?MCHC 33.0 - 37.0 G/DL 59.5   ?RDW 12.3 - 17.0 % 13.7   ?Platelets 150 - 450 X 10*3/uL 370   ?MPV 6.8 - 10.2 FL 7.6   ?Neutrophil % % 77   ?Lymphocyte % % 15   ?Monocyte % % 8   ?Eosinophil % % 1   ?Basophil % % 1   ?Neutrophil Absolute 1.8 - 7.8 x 10*3/uL 9.6 High    ?Lymphocyte Absolute 1.0 - 4.8 x 10*3/uL 1.9   ?Monocyte Absolute 0.0 - 0.8 x 10*3/uL 0.9 High    ?Eosinophil Absolute 0.0 - 0.5 x 10*3/uL 0.1   ?Basophil Absolute 0.0 - 0.2 x 10*3/uL 0.1   ?Resulting Agency  [facility]  ?Specimen Collected: 07/08/21 13:22 Last Resulted: 07/08/21 14:34  ?Received From: [facility]  Result Received: 07/08/21 18:52  ? ?

## 2021-07-08 NOTE — ED Provider Notes (Signed)
?[facility] EMERGENCY DEPARTMENT ?Provider Note ? ? ?CSN: [id number] ?Arrival date & time: 07/08/21  9150 ? ?  ? ?History ? ?Chief Complaint  ?Patient presents with  ? Abdominal Pain  ? ? ?Alicia Callahan is a 61 y.o. female who presents emergency department complaining of right upper quadrant pain/flank pain x1 week.  Patient was initially evaluated in urgent care earlier this week, and her symptoms continue to worsen.  She went to urgent care again this morning, and had labs and an abdominal x-ray done.  They told her that her abdominal x-ray showed a significant stool burden, and encouraged MiraLAX.  They advised she present to the emergency department if her pain got any worse, to obtain an ultrasound of her gallbladder.  She is also complaining of nausea, no vomiting.  She believes her pain got worse throughout the week as she was eating more processed foods. ? ?Abdominal Pain ?Associated symptoms: constipation and nausea   ?Associated symptoms: no chest pain, no chills, no diarrhea, no dysuria, no fever, no hematuria, no shortness of breath and no vomiting   ? ?  ? ?Home Medications ?Prior to Admission medications   ?Medication Sig Start Date End Date Taking? Authorizing Provider  ?potassium chloride SA (KLOR-CON M) 20 MEQ tablet Take 1 tablet (20 mEq total) by mouth 2 (two) times daily for 3 days. 07/08/21 07/11/21 Yes Ricquel Foulk T, PA-C  ?Acetaminophen-Caffeine (TENSION HEADACHE) 500-65 MG TABS Take 2 tablets by mouth every 4 (four) hours as needed (pain).    [provider]  ?buPROPion (WELLBUTRIN) 100 MG tablet Take 1 tablet (100 mg total) by mouth 2 (two) times daily. ?Patient taking differently: Take 100 mg by mouth daily.  08/23/11 01/14/18  Gayland Curry, MD  ?cephALEXin (KEFLEX) 500 MG capsule Take 1 capsule (500 mg total) by mouth 4 (four) times daily for 10 days. 07/08/21 07/18/21  Amberley Hamler T, PA-C  ?clonazePAM (KLONOPIN) 1 MG tablet Take 1 mg by mouth 2 (two) times  daily as needed for anxiety.     [provider]  ?Escitalopram Oxalate (LEXAPRO PO) Take 20 mg by mouth daily.     [provider]  ?gabapentin (NEURONTIN) 100 MG capsule Take 1-3 capsules by mouth at bedtime as needed. pain 08/02/14   [provider]  ?Ginkgo Biloba Extract (GNP GINGKO BILOBA EXTRACT) 60 MG CAPS Take 1 capsule by mouth daily.    [provider]  ?HYDROcodone-acetaminophen (NORCO/VICODIN) 5-325 MG per tablet Take 1 tablet by mouth every 8 (eight) hours as needed. pain 10/26/14   [provider]  ?hyoscyamine (ANASPAZ) 0.125 MG TBDP Place 0.125 mg under the tongue.    [provider]  ?lisinopril (PRINIVIL,ZESTRIL) 5 MG tablet Take 5 mg by mouth daily. 10/04/14 01/14/18  [provider]  ?metFORMIN (GLUCOPHAGE) 500 MG tablet Take 1 tablet by mouth 2 (two) times daily. 06/21/14   [provider]  ?metoprolol succinate (TOPROL-XL) 50 MG 24 hr tablet Take 1 tablet by mouth every morning. 10/18/14   [provider]  ?pantoprazole (PROTONIX) 40 MG tablet Take 40 mg by mouth daily. 10/04/14 10/04/15  [provider]  ?simvastatin (ZOCOR) 10 MG tablet Take 10 mg by mouth at bedtime. 10/06/14 01/14/18  [provider]  ?vitamin E 100 UNIT capsule Take 100 Units by mouth daily.    [provider]  ?zolpidem (AMBIEN) 5 MG tablet Take 5 mg by mouth at bedtime as needed for sleep.     [provider]  ?   ? ?Allergies    ?Sulphadimidine [sulfamethazine] and Sulphur [elemental sulfur]   ? ?Review of Systems   ?Review of Systems  ?Constitutional:  Negative for chills and fever.  ?Respiratory:  Negative for shortness of breath.   ?Cardiovascular:  Negative for chest pain.  ?Gastrointestinal:  Positive for abdominal pain, constipation and nausea. Negative for diarrhea and vomiting.  ?Genitourinary:  Positive for flank pain. Negative for dysuria, frequency, hematuria and urgency.  ?All other systems reviewed and  are negative. ? ?Physical Exam ?Updated Vital Signs ?BP 116/60   Pulse 93   Temp 98.1 ?F (36.7 ?C) (Oral)   Resp (!) 21   Ht 5\' 3"  (1.6 m)   Wt 58.5 kg   SpO2 99%   BMI 22.85 kg/m?  ?Physical Exam ?Vitals and nursing note reviewed.  ?Constitutional:   ?   Appearance: Normal appearance.  ?HENT:  ?   Head: Normocephalic and atraumatic.  ?Eyes:  ?   Conjunctiva/sclera: Conjunctivae normal.  ?Cardiovascular:  ?   Rate and Rhythm: Normal rate and regular rhythm.  ?Pulmonary:  ?   Effort: Pulmonary effort is normal. No respiratory distress.  ?   Breath sounds: Normal breath sounds.  ?Abdominal:  ?   General: There is no distension.  ?   Palpations: Abdomen is soft.  ?   Tenderness: There is abdominal tenderness in the right upper quadrant and epigastric area. There is no right CVA tenderness, left CVA tenderness, guarding or rebound.  ?Skin: ?   General: Skin is warm and dry.  ?Neurological:  ?   General: No focal deficit present.  ?   Mental Status: She is alert.  ? ? ?ED Results / Procedures / Treatments   ?Labs ?(all labs ordered are listed, but only abnormal results are displayed) ?Labs Reviewed  ?CBC WITH DIFFERENTIAL/PLATELET - Abnormal; Notable for the following components:  ?    Result Value  ? WBC 10.7 (*)   ? RBC 3.67 (*)   ? Hemoglobin 11.2 (*)   ? HCT 32.8 (*)   ? All other components within normal limits  ?COMPREHENSIVE METABOLIC PANEL - Abnormal; Notable for the following components:  ? Sodium 126 (*)   ? Potassium 3.1 (*)   ? Chloride 90 (*)   ? Glucose, Bld 149 (*)   ? Calcium 8.1 (*)   ? Total Protein 6.3 (*)   ? Albumin 3.1 (*)   ? All other components within normal limits  ?URINALYSIS, ROUTINE W REFLEX MICROSCOPIC - Abnormal; Notable for the following components:  ? pH 8.5 (*)   ? Leukocytes,Ua MODERATE (*)   ? All other components within normal limits  ?URINALYSIS, MICROSCOPIC (REFLEX) - Abnormal; Notable for the following components:  ? Bacteria, UA MANY (*)   ? All other components within  normal limits  ? ? ?EKG ?None ? ?Radiology ?CT ABDOMEN PELVIS W CONTRAST ? ?Result Date: 07/08/2021 ?CLINICAL DATA:  Right upper quadrant abdominal pain EXAM: CT ABDOMEN AND PELVIS WITH CONTRAST TECHNIQUE: Multidetector CT imaging of the abdomen and pelvis was performed using the standard protocol following bolus administration of intravenous contrast. RADIATION DOSE REDUCTION: This exam was performed according to the departmental dose-optimization program which includes automated exposure control, adjustment of the mA and/or kV according to patient size and/or use of iterative reconstruction technique. CONTRAST:  100mL OMNIPAQUE IOHEXOL 300 MG/ML  SOLN COMPARISON:  None. FINDINGS: Lower chest: Lung bases are clear. Hepatobiliary: 13 mm cyst in the central right  hepatic lobe (series 2/image 24), benign. Gallbladder is unremarkable. No intrahepatic or extrahepatic ductal dilatation. Pancreas: Within normal limits. Spleen: Within normal limits. Adrenals/Urinary Tract: Adrenal glands are within normal limits. Focal heterogeneous enhancement along the lateral right upper kidney (coronal image 34). This appearance is nonspecific but raises concern for pyelonephritis. Left kidney is within normal limits.  No hydronephrosis. Trace nondependent gas in the bladder (series 2/image 71), correlate for recent intervention/catheterization. Stomach/Bowel: Stomach is within normal limits. No evidence of bowel obstruction. Normal appendix (series 2/image 47). No colonic wall thickening or inflammatory changes. Vascular/Lymphatic: No evidence of abdominal aortic aneurysm. No suspicious abdominopelvic lymphadenopathy. Reproductive: Uterus is within normal limits. Bilateral ovaries are within normal limits. Other: No abdominopelvic ascites. Musculoskeletal: Very mild degenerative changes at L5-S1. IMPRESSION: Focal heterogeneous enhancement along the lateral right upper kidney, nonspecific but raising concern for pyelonephritis. Trace  nondependent gas in the bladder, correlate for recent intervention/catheterization. Otherwise, this may be related to cystitis. Electronically Signed   By: Charline Bills M.D.   On: 07/08/2021 22:44   ? ?Proce

## 2021-07-08 NOTE — ED Notes (Signed)
With CT 

## 2021-07-08 NOTE — ED Notes (Signed)
Comprehensive Metabolic Panel ?Specimen:  Blood ? Ref Range & Units Today Comments  ?Sodium 135 - 146 MMOL/L 131 Low     ?Potassium 3.5 - 5.3 MMOL/L 3.6    ?Chloride 98 - 110 MMOL/L 95 Low     ?CO2 23 - 30 MMOL/L 28    ?BUN 8 - 24 MG/DL 11    ?Glucose 70 - 99 MG/DL 159 High   Patients taking eltrombopag at doses >/= 100 mg daily may show falsely elevated values of 10% or greater.  ?Creatinine 0.50 - 1.50 MG/DL 0.73    ?Calcium 8.5 - 10.5 MG/DL 8.7    ?Total Protein 6.0 - 8.3 G/DL 6.2  Patients taking eltrombopag at doses >/= 100 mg daily may show falsely elevated values of 10% or greater.  ?Albumin  3.5 - 5.0 G/DL 3.5    ?Total Bilirubin 0.1 - 1.2 MG/DL 0.3  Patients taking eltrombopag at doses >/= 100 mg daily may show falsely elevated values of 10% or greater.  ?Alkaline Phosphatase 25 - 125 IU/L or U/L 57    ?AST (SGOT) 5 - 40 IU/L or U/L 12    ?ALT (SGPT) 5 - 50 IU/L or U/L 10    ?Anion Gap 4 - 14 MMOL/L 8    ?Est. GFR >=60 ML/MIN/1.73 M*2 >90  GFR estimated by CKD-EPI equations(NKF 2021).  ? ?"Recommend confirmation of Cr-based eGFR by using Cys-based eGFR and other filtration markers (if applicable) in complex cases and clinical decision-making, as needed."  ?Resulting Agency  [facility]   ?Specimen Collected: 07/08/21 13:22 Last Resulted: 07/08/21 14:46  ?Received From: [facility]  Result Received: 07/08/21 18:52  ?  ? ?

## 2021-07-14 ENCOUNTER — Telehealth (HOSPITAL_COMMUNITY): Payer: Self-pay

## 2022-06-11 IMAGING — CT CT ABD-PELV W/ CM
2 of 5 series · 16 of 46 positions shown, 18 images · IV contrast (Omnipaque)
Comparison: None.

CLINICAL DATA: Right upper quadrant abdominal pain

EXAM:
CT ABDOMEN AND PELVIS WITH CONTRAST
TECHNIQUE: Multidetector CT imaging of the abdomen and pelvis was performed
using the standard protocol following bolus administration of
intravenous contrast.

[Series 2: axial st · axial · 0.79mm/px · z∈[+1159,+1559]mm · 13 of 90 slices shown, 15 images]
[im 5/90  soft-tissue]
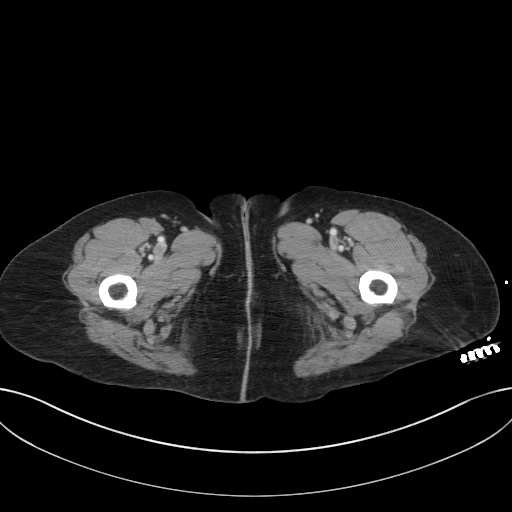
[im 5/90  bone]
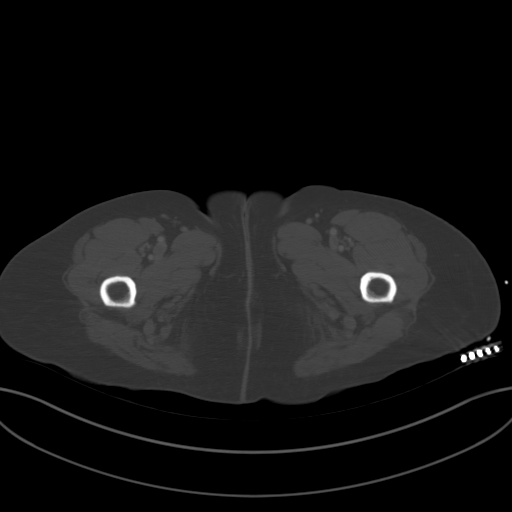
[im 14/90  soft-tissue]
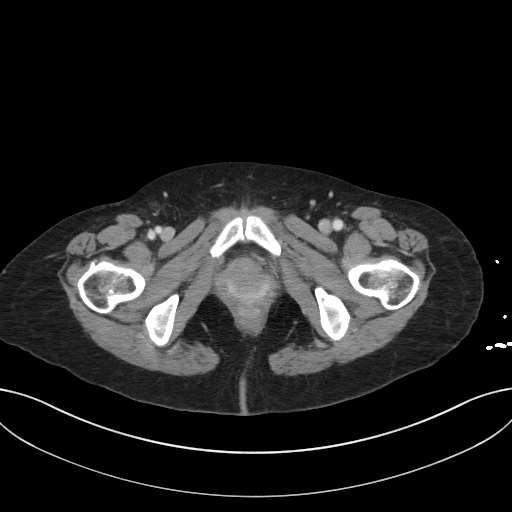
[im 18/90  soft-tissue]
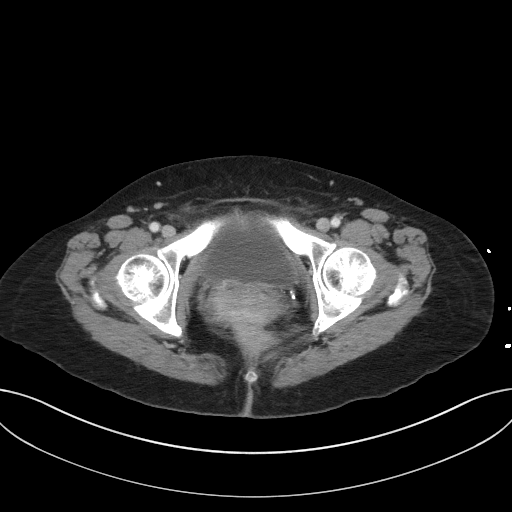
[im 27/90  soft-tissue]
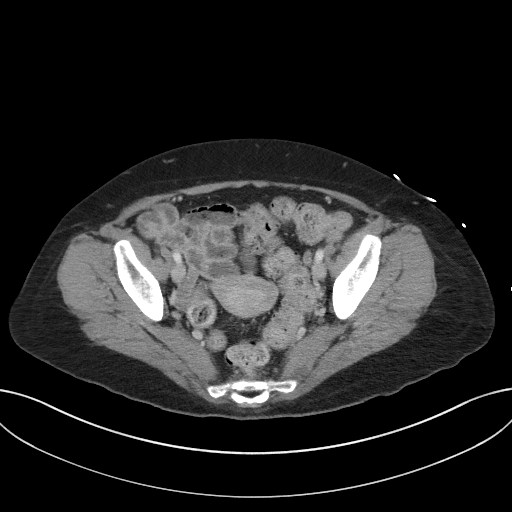
[im 32/90  soft-tissue]
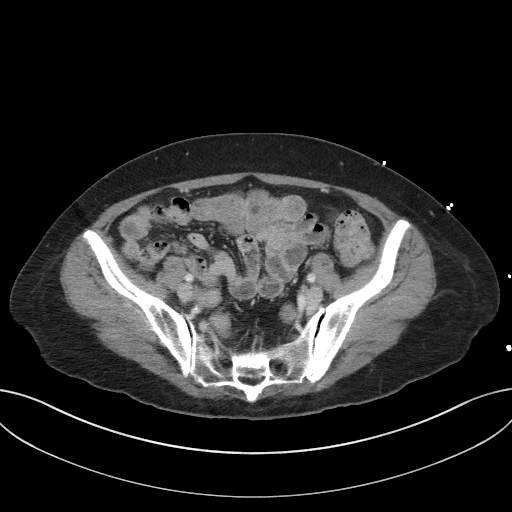
[im 41/90  soft-tissue]
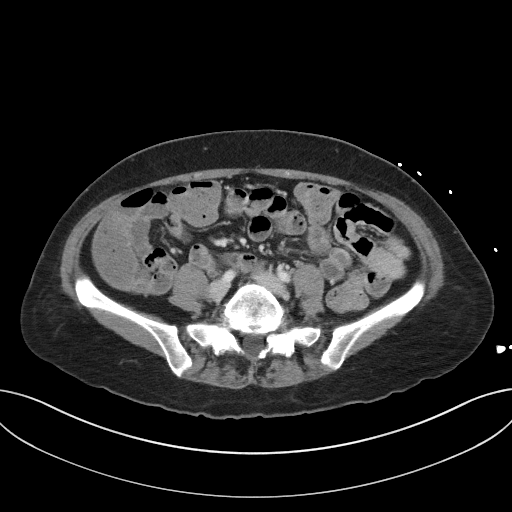
[im 45/90  soft-tissue]
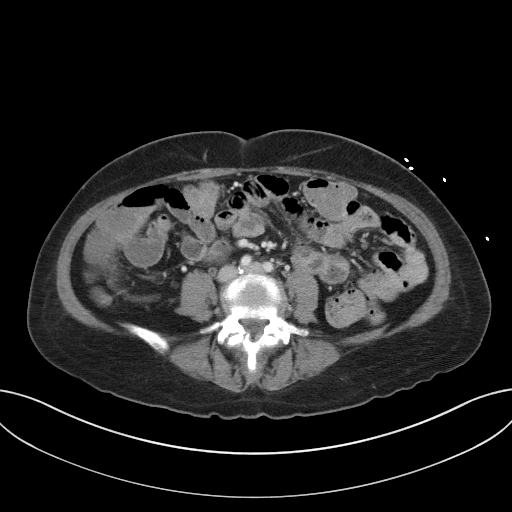
[im 49/90  soft-tissue]
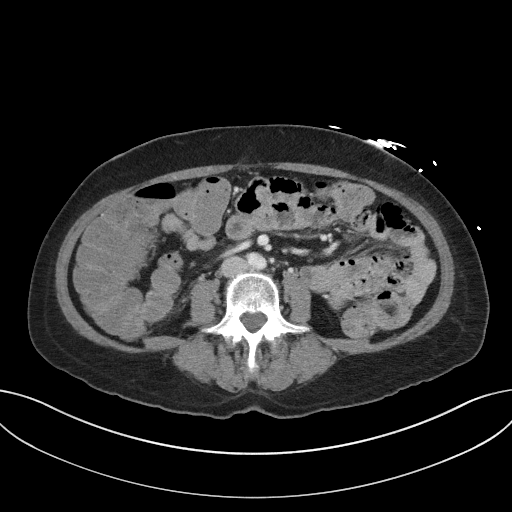
[im 58/90  soft-tissue]
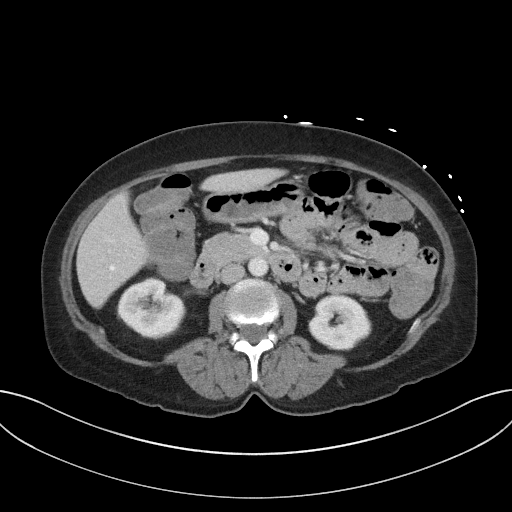
[im 58/90  bone]
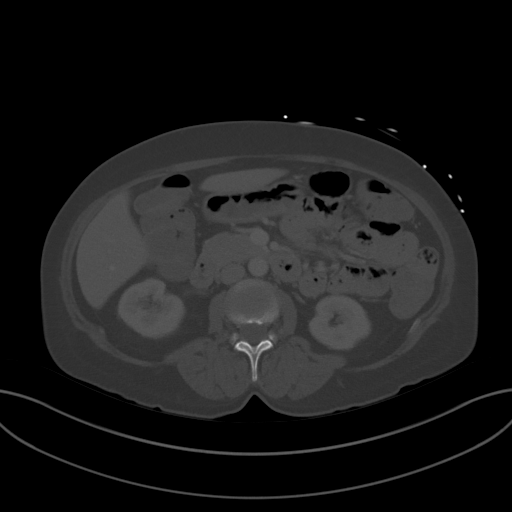
[im 63/90  soft-tissue]
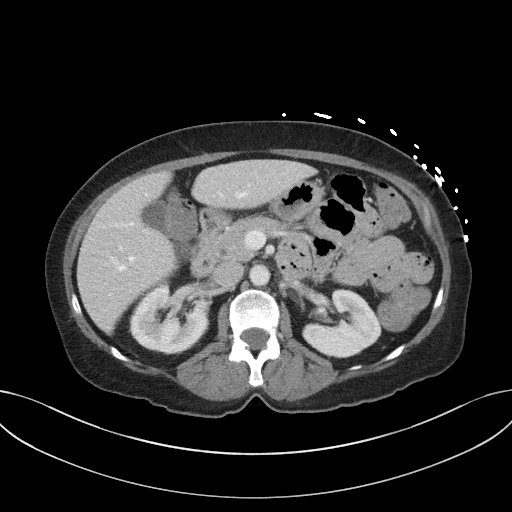
[im 72/90  soft-tissue]
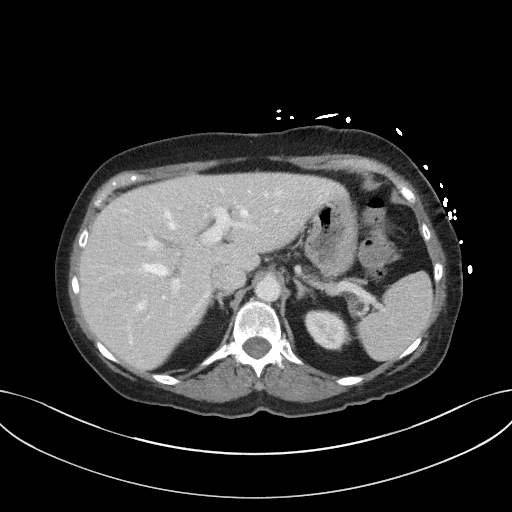
[im 76/90  soft-tissue]
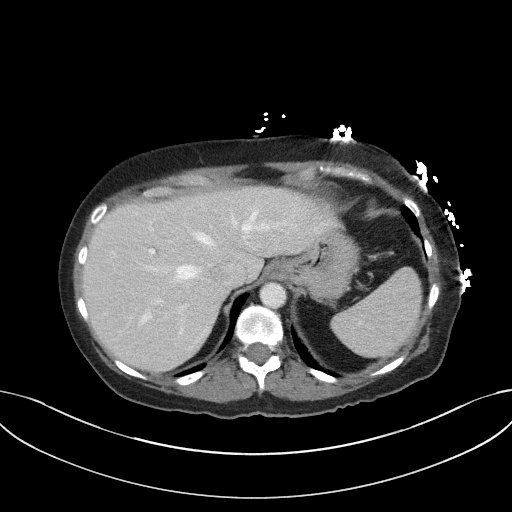
[im 85/90  soft-tissue]
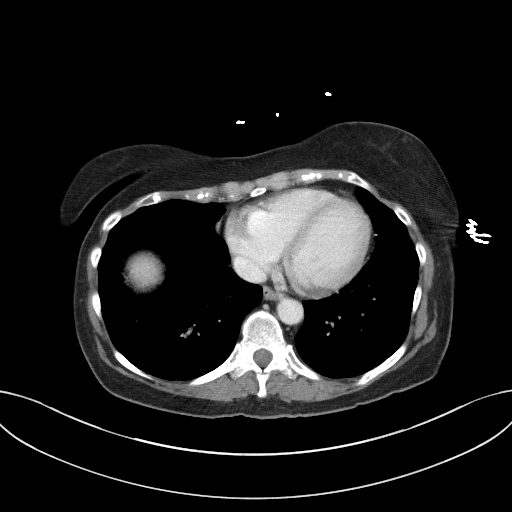

[Series 5: coronal st · coronal · 0.70mm/px · 3 of 99 slices shown]
[im 33/99  soft-tissue]
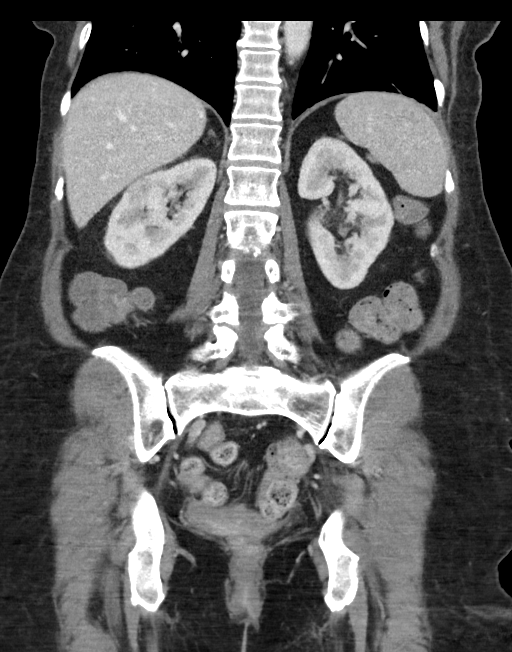
[im 44/99  soft-tissue]
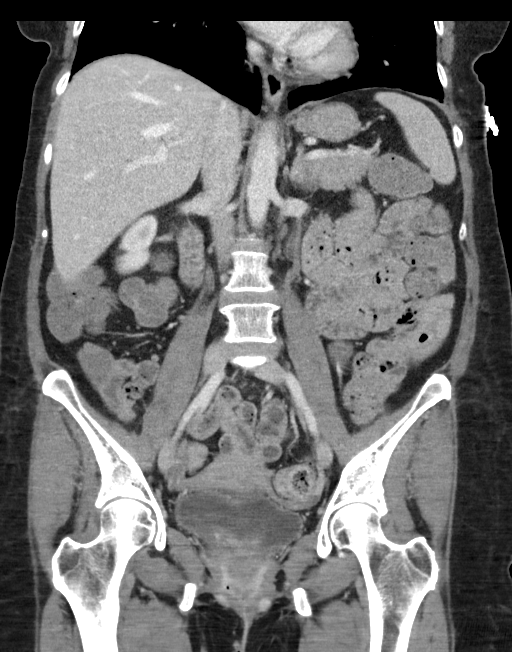
[im 55/99  soft-tissue]
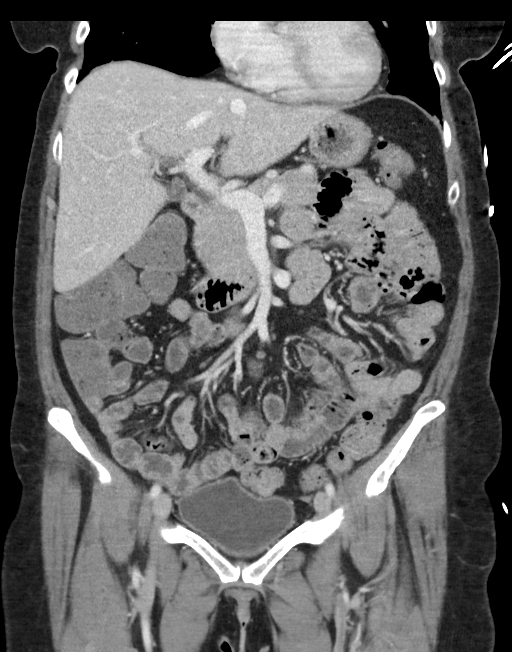

[16 of 46 positions shown; findings below may reference images not displayed]

RADIATION DOSE REDUCTION: This exam was performed according to the
departmental dose-optimization program which includes automated
exposure control, adjustment of the mA and/or kV according to
patient size and/or use of iterative reconstruction technique.

CONTRAST:  100mL OMNIPAQUE IOHEXOL 300 MG/ML  SOLN
FINDINGS: Lower chest: Lung bases are clear.

Hepatobiliary: 13 mm cyst in the central right hepatic lobe (series
2/image 24), benign.

Gallbladder is unremarkable. No intrahepatic or extrahepatic ductal
dilatation.

Pancreas: Within normal limits.

Spleen: Within normal limits.

Adrenals/Urinary Tract: Adrenal glands are within normal limits.

Focal heterogeneous enhancement along the lateral right upper kidney
(coronal image 34). This appearance is nonspecific but raises
concern for pyelonephritis.

Left kidney is within normal limits.  No hydronephrosis.

Trace nondependent gas in the bladder (series 2/image 71), correlate
for recent intervention/catheterization.

Stomach/Bowel: Stomach is within normal limits.

No evidence of bowel obstruction.

Normal appendix (series 2/image 47).

No colonic wall thickening or inflammatory changes.

Vascular/Lymphatic: No evidence of abdominal aortic aneurysm.

No suspicious abdominopelvic lymphadenopathy.

Reproductive: Uterus is within normal limits. Bilateral ovaries are
within normal limits.

Other: No abdominopelvic ascites.

Musculoskeletal: Very mild degenerative changes at L5-S1.
IMPRESSION: Focal heterogeneous enhancement along the lateral right upper
kidney, nonspecific but raising concern for pyelonephritis.

Trace nondependent gas in the bladder, correlate for recent
intervention/catheterization. Otherwise, this may be related to
cystitis.

## 2022-10-15 ENCOUNTER — Emergency Department (HOSPITAL_BASED_OUTPATIENT_CLINIC_OR_DEPARTMENT_OTHER): Payer: Medicare HMO

## 2022-10-15 ENCOUNTER — Emergency Department (HOSPITAL_BASED_OUTPATIENT_CLINIC_OR_DEPARTMENT_OTHER)
Admission: EM | Admit: 2022-10-15 | Discharge: 2022-10-15 | Disposition: A | Discharge status: Home / Self Care | Payer: Medicare HMO | Attending: Emergency Medicine | Admitting: Emergency Medicine

## 2022-10-15 ENCOUNTER — Encounter (HOSPITAL_BASED_OUTPATIENT_CLINIC_OR_DEPARTMENT_OTHER): Payer: Self-pay

## 2022-10-15 DIAGNOSIS — Z79899 Other long term (current) drug therapy: Secondary | ICD-10-CM | POA: Insufficient documentation

## 2022-10-15 DIAGNOSIS — R9431 Abnormal electrocardiogram [ECG] [EKG]: Secondary | ICD-10-CM | POA: Diagnosis not present

## 2022-10-15 DIAGNOSIS — I10 Essential (primary) hypertension: Secondary | ICD-10-CM | POA: Diagnosis not present

## 2022-10-15 DIAGNOSIS — Z7984 Long term (current) use of oral hypoglycemic drugs: Secondary | ICD-10-CM | POA: Insufficient documentation

## 2022-10-15 DIAGNOSIS — E1165 Type 2 diabetes mellitus with hyperglycemia: Secondary | ICD-10-CM | POA: Insufficient documentation

## 2022-10-15 DIAGNOSIS — R1013 Epigastric pain: Secondary | ICD-10-CM | POA: Insufficient documentation

## 2022-10-15 DIAGNOSIS — E871 Hypo-osmolality and hyponatremia: Secondary | ICD-10-CM | POA: Insufficient documentation

## 2022-10-15 DIAGNOSIS — E876 Hypokalemia: Secondary | ICD-10-CM | POA: Diagnosis not present

## 2022-10-15 LAB — URINALYSIS, ROUTINE W REFLEX MICROSCOPIC
Bilirubin Urine: NEGATIVE
Glucose, UA: NEGATIVE mg/dL
Hgb urine dipstick: NEGATIVE
Ketones, ur: NEGATIVE mg/dL
Leukocytes,Ua: NEGATIVE
Nitrite: NEGATIVE
Protein, ur: NEGATIVE mg/dL
Specific Gravity, Urine: 1.02 (ref 1.005–1.030)
pH: 7.5 (ref 5.0–8.0)

## 2022-10-15 LAB — CBC
HCT: 37.4 % (ref 36.0–46.0)
Hemoglobin: 12.6 g/dL (ref 12.0–15.0)
MCH: 30.7 pg (ref 26.0–34.0)
MCHC: 33.7 g/dL (ref 30.0–36.0)
MCV: 91.2 fL (ref 80.0–100.0)
Platelets: 382 10*3/uL (ref 150–400)
RBC: 4.1 MIL/uL (ref 3.87–5.11)
RDW: 13.2 % (ref 11.5–15.5)
WBC: 8.6 10*3/uL (ref 4.0–10.5)
nRBC: 0 % (ref 0.0–0.2)

## 2022-10-15 LAB — TROPONIN I (HIGH SENSITIVITY): Troponin I (High Sensitivity): <2 ng/L (ref <18)

## 2022-10-15 LAB — COMPREHENSIVE METABOLIC PANEL
ALT: 15 U/L (ref 0–44)
AST: 18 U/L (ref 15–41)
Albumin: 3.7 g/dL (ref 3.5–5.0)
Alkaline Phosphatase: 52 U/L (ref 38–126)
Anion gap: 11 (ref 5–15)
BUN: 15 mg/dL (ref 8–23)
CO2: 28 mmol/L (ref 22–32)
Calcium: 9.7 mg/dL (ref 8.9–10.3)
Chloride: 94 mmol/L — ABNORMAL LOW (ref 98–111)
Creatinine, Ser: 0.79 mg/dL (ref 0.44–1.00)
GFR, Estimated: >60 mL/min (ref >60)
Glucose, Bld: 167 mg/dL — ABNORMAL HIGH (ref 70–99)
Potassium: 3.4 mmol/L — ABNORMAL LOW (ref 3.5–5.1)
Sodium: 133 mmol/L — ABNORMAL LOW (ref 135–145)
Total Bilirubin: 0.5 mg/dL (ref 0.3–1.2)
Total Protein: 6.5 g/dL (ref 6.5–8.1)

## 2022-10-15 MED ORDER — ACETAMINOPHEN 500 MG PO TABS
1000.0000 mg | ORAL_TABLET | Freq: Once | ORAL | Status: AC
  Administered 2022-10-15: 1000 mg via ORAL
  Filled 2022-10-15: qty 2

## 2022-10-15 NOTE — Discharge Instructions (Signed)
Please follow-up with cardiology for echo, stress test, or general risk stratification given your high blood pressure, high cholesterol, diabetes, and no formal evaluation by cardiology previously.  There was no evidence of heart attack, or other significant lab abnormalities to explain your feeling oddly over the weekend, I do not see any evidence of any emergent or surgical causes to explain how you are feeling, if you continue to have some abdominal pain and nausea you can try some over-the-counter Maalox, Mylanta, Pepcid, and follow-up with your primary care doctor, if you begin to develop chest pain, shortness of breath, feeling of heart racing abnormally please return to the emergency department for further evaluation

## 2022-10-15 NOTE — ED Triage Notes (Signed)
Since yesterday has had indigestion, left arm pain, nausea, leg pain. Had diarrhea on Friday. Went to UC, sent here for abnormal EKG.

## 2022-10-15 NOTE — ED Provider Notes (Signed)
[place] EMERGENCY DEPARTMENT AT [facility] Provider Note   CSN: [id number] Arrival date & time: 10/15/22  1845     History  Chief Complaint  Patient presents with   Abdominal Pain   Abnormal ECG    Alicia Callahan is a 62 y.o. female past medical history significant for anxiety, depression, diabetes, hypertension, high cholesterol who presents with concern for some general feeling of unwellness, some epigastric abdominal pain, nausea, some intermittent leg cramping over the weekend.  Patient reports that she went to urgent care and was sent to the emergency department based on an abnormal EKG.  Patient denies any chest pain, shortness of breath.  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  She reports that she had some swelling in bilateral legs, not 1 greater than the other when she went to [place] recently but it is since resolved.  No previous history of blood clots.  Patient reports that she feels quite anxious based on report that she may need to come to the emergency department based on her EKG.  She denies any chest pain at this time, reports some very mild abdominal pain.   Abdominal Pain      Home Medications Prior to Admission medications   Medication Sig Start Date End Date Taking? Authorizing Provider  Acetaminophen-Caffeine (TENSION HEADACHE) 500-65 MG TABS Take 2 tablets by mouth every 4 (four) hours as needed (pain).    [provider]  buPROPion (WELLBUTRIN) 100 MG tablet Take 1 tablet (100 mg total) by mouth 2 (two) times daily. Patient taking differently: Take 100 mg by mouth daily.  08/23/11 01/14/18  Gayland Curry, MD  clonazePAM (KLONOPIN) 1 MG tablet Take 1 mg by mouth 2 (two) times daily as needed for anxiety.     [provider]  Escitalopram Oxalate (LEXAPRO PO) Take 20 mg by mouth daily.     [provider]  gabapentin (NEURONTIN) 100 MG  capsule Take 1-3 capsules by mouth at bedtime as needed. pain 08/02/14   [provider]  Ginkgo Biloba Extract (GNP GINGKO BILOBA EXTRACT) 60 MG CAPS Take 1 capsule by mouth daily.    [provider]  HYDROcodone-acetaminophen (NORCO/VICODIN) 5-325 MG per tablet Take 1 tablet by mouth every 8 (eight) hours as needed. pain 10/26/14   [provider]  hyoscyamine (ANASPAZ) 0.125 MG TBDP Place 0.125 mg under the tongue.    [provider]  lisinopril (PRINIVIL,ZESTRIL) 5 MG tablet Take 5 mg by mouth daily. 10/04/14 01/14/18  [provider]  metFORMIN (GLUCOPHAGE) 500 MG tablet Take 1 tablet by mouth 2 (two) times daily. 06/21/14   [provider]  metoprolol succinate (TOPROL-XL) 50 MG 24 hr tablet Take 1 tablet by mouth every morning. 10/18/14   [provider]  pantoprazole (PROTONIX) 40 MG tablet Take 40 mg by mouth daily. 10/04/14 10/04/15  [provider]  potassium chloride SA (KLOR-CON M) 20 MEQ tablet Take 1 tablet (20 mEq total) by mouth 2 (two) times daily for 3 days. 07/08/21 07/11/21  Roemhildt, Lorin T, PA-C  simvastatin (ZOCOR) 10 MG tablet Take 10 mg by mouth at bedtime. 10/06/14 01/14/18  [provider]  vitamin E 100 UNIT capsule Take 100 Units by mouth daily.    [provider]  zolpidem (AMBIEN) 5 MG tablet Take 5 mg by mouth at bedtime as needed for sleep.     [provider]      Allergies    Sulphadimidine [sulfamethazine] and Sulphur [elemental sulfur]    Review of Systems   Review of Systems  Gastrointestinal:  Positive for abdominal pain.  All other systems reviewed and are negative.   Physical Exam Updated Vital Signs BP 137/72   Pulse 73   Temp 98.6 F (37 C)   Resp 19   Ht 5' 2.5" (1.588 m)   Wt 58.1 kg   SpO2 95%   BMI 23.04 kg/m  Physical Exam Vitals and nursing note reviewed.  Constitutional:      General: She is not in acute distress.    Appearance: Normal  appearance.  HENT:     Head: Normocephalic and atraumatic.  Eyes:     General:        Right eye: No discharge.        Left eye: No discharge.  Cardiovascular:     Rate and Rhythm: Normal rate and regular rhythm.     Heart sounds: No murmur heard.    No friction rub. No gallop.  Pulmonary:     Effort: Pulmonary effort is normal.     Breath sounds: Normal breath sounds.  Abdominal:     General: Bowel sounds are normal.     Palpations: Abdomen is soft.     Comments: Mild tenderness to palpation epigastric region, no rebound, rigidity, guarding, normal bowel sounds throughout.  Skin:    General: Skin is warm and dry.     Capillary Refill: Capillary refill takes less than 2 seconds.  Neurological:     Mental Status: She is alert and oriented to person, place, and time.  Psychiatric:        Mood and Affect: Mood normal.        Behavior: Behavior normal.     ED Results / Procedures / Treatments   Labs (all labs ordered are listed, but only abnormal results are displayed) Labs Reviewed  COMPREHENSIVE METABOLIC PANEL - Abnormal; Notable for the following components:      Result Value   Sodium 133 (*)    Potassium 3.4 (*)    Chloride 94 (*)    Glucose, Bld 167 (*)    All other components within normal limits  CBC  URINALYSIS, ROUTINE W REFLEX MICROSCOPIC  TROPONIN I (HIGH SENSITIVITY)    EKG EKG Interpretation  Date/Time:  Monday October 15 2022 19:02:47 EDT Ventricular Rate:  82 PR Interval:  171 QRS Duration: 86 QT Interval:  340 QTC Calculation: 397 R Axis:   74 Text Interpretation: Sinus rhythm Probable anteroseptal infarct, old Confirmed by Vonita Moss [phone]) on 10/15/2022 7:52:22 PM  Radiology DG Chest 2 View  Result Date: 10/15/2022 CLINICAL DATA:  Chest pain, left arm pain, indigestion EXAM: CHEST - 2 VIEW COMPARISON:  None Available. FINDINGS: The heart size and mediastinal contours are within normal limits. Both lungs are clear. The visualized skeletal  structures are unremarkable. IMPRESSION: No active cardiopulmonary disease. Electronically Signed   By: Sharlet Salina M.D.   On: 10/15/2022 19:29    Procedures Procedures    Medications Ordered in ED Medications  acetaminophen (TYLENOL) tablet 1,000 mg (1,000 mg Oral Given 10/15/22 2017)    ED Course/ Medical Decision Making/ A&P                             Medical Decision Making Amount and/or Complexity of Data Reviewed Labs: ordered. Radiology: ordered.  This patient is a 62 y.o. female  who presents to the ED for concern of abdominal pain, concern for abnormal EKG, nausea.   Differential diagnoses prior to evaluation: The emergent differential diagnosis includes, but is not limited to,  esophagitis, gastritis, peptic ulcer disease, esophageal rupture, gastric rupture, Boerhaave's, Mallory-Weiss, pancreatitis, cholecystitis, cholangitis, acute mesenteric ischemia, atypical chest pain or ACS, lower lobar pneumonia versus other . This is not an exhaustive differential.   Past Medical History / Co-morbidities / Social History: anxiety, depression, diabetes, hypertension, high cholesterol  Additional history: Chart reviewed. Pertinent results include: reviewed urgent care evaluation just prior to arrival, compared EKG to previous EKGs  Physical Exam: Physical exam performed. The pertinent findings include:  Mild tenderness to palpation epigastric region, no rebound, rigidity, guarding, normal bowel sounds throughout.  No rebound, rigidity, guarding of epigastric region, normal heart sounds throughout.  Lab Tests/Imaging studies: I personally interpreted labs/imaging and the pertinent results include: Patient with very mild hyponatremia, mild hypokalemia, glucose elevated in context of known diabetes, but not severely elevated, normal anion gap with glucose 167 today.  CBC unremarkable, troponin negative x 1 in context of no active chest pain, UA unremarkable.  I independently  interpreted plain film chest x-ray which shows no evidence of acute thoracic abnormality.. I agree with the radiologist interpretation.  Cardiac monitoring: EKG obtained and interpreted by myself and attending physician which shows: Normal sinus rhythm   Medications: I ordered medication including Tylenol for pain.  I have reviewed the patients home medicines and have made adjustments as needed.   Disposition: After consideration of the diagnostic results and the patients response to treatment, I feel that patient with no evidence of acute ACS today, discussed with patient that given history of hypertension, high cholesterol, diabetes that she would likely benefit from non emergent cardiology evaluation for stress test, echo.   emergency department workup does not suggest an emergent condition requiring admission or immediate intervention beyond what has been performed at this time. The plan is: as above --no evidence of surgical or emergent cause of her abdominal pain, patient in no acute distress with stable vital signs at time of discharge, return precautions given.. The patient is safe for discharge and has been instructed to return immediately for worsening symptoms, change in symptoms or any other concerns.  Final Clinical Impression(s) / ED Diagnoses Final diagnoses:  Epigastric pain  Abnormal EKG    Rx / DC Orders ED Discharge Orders          Ordered    Ambulatory referral to Cardiology        10/15/22 2027              [place] 10/15/22 2057    Rondel Baton, MD 10/15/22 2352

## 2022-11-07 ENCOUNTER — Emergency Department (HOSPITAL_BASED_OUTPATIENT_CLINIC_OR_DEPARTMENT_OTHER)
Admission: EM | Admit: 2022-11-07 | Discharge: 2022-11-07 | Disposition: A | Discharge status: Home / Self Care | Payer: Medicare HMO | Attending: Emergency Medicine | Admitting: Emergency Medicine

## 2022-11-07 ENCOUNTER — Other Ambulatory Visit: Payer: Self-pay

## 2022-11-07 ENCOUNTER — Emergency Department (HOSPITAL_BASED_OUTPATIENT_CLINIC_OR_DEPARTMENT_OTHER): Payer: Medicare HMO

## 2022-11-07 ENCOUNTER — Encounter (HOSPITAL_BASED_OUTPATIENT_CLINIC_OR_DEPARTMENT_OTHER): Payer: Self-pay

## 2022-11-07 DIAGNOSIS — K5900 Constipation, unspecified: Secondary | ICD-10-CM | POA: Diagnosis not present

## 2022-11-07 DIAGNOSIS — R002 Palpitations: Secondary | ICD-10-CM | POA: Insufficient documentation

## 2022-11-07 DIAGNOSIS — R072 Precordial pain: Secondary | ICD-10-CM | POA: Diagnosis not present

## 2022-11-07 DIAGNOSIS — R197 Diarrhea, unspecified: Secondary | ICD-10-CM | POA: Insufficient documentation

## 2022-11-07 DIAGNOSIS — I10 Essential (primary) hypertension: Secondary | ICD-10-CM | POA: Insufficient documentation

## 2022-11-07 DIAGNOSIS — R1013 Epigastric pain: Secondary | ICD-10-CM | POA: Insufficient documentation

## 2022-11-07 DIAGNOSIS — E119 Type 2 diabetes mellitus without complications: Secondary | ICD-10-CM | POA: Insufficient documentation

## 2022-11-07 LAB — CBC WITH DIFFERENTIAL/PLATELET
Abs Immature Granulocytes: 0.06 10*3/uL (ref 0.00–0.07)
Basophils Absolute: 0.1 10*3/uL (ref 0.0–0.1)
Basophils Relative: 1 %
Eosinophils Absolute: 4 10*3/uL — ABNORMAL HIGH (ref 0.0–0.5)
Eosinophils Relative: 26 %
HCT: 32.6 % — ABNORMAL LOW (ref 36.0–46.0)
Hemoglobin: 11 g/dL — ABNORMAL LOW (ref 12.0–15.0)
Immature Granulocytes: 0 %
Lymphocytes Relative: 27 %
Lymphs Abs: 4.1 10*3/uL — ABNORMAL HIGH (ref 0.7–4.0)
MCH: 30.2 pg (ref 26.0–34.0)
MCHC: 33.7 g/dL (ref 30.0–36.0)
MCV: 89.6 fL (ref 80.0–100.0)
Monocytes Absolute: 0.8 10*3/uL (ref 0.1–1.0)
Monocytes Relative: 5 %
Neutro Abs: 6.1 10*3/uL (ref 1.7–7.7)
Neutrophils Relative %: 41 %
Platelets: 345 10*3/uL (ref 150–400)
RBC: 3.64 MIL/uL — ABNORMAL LOW (ref 3.87–5.11)
RDW: 13.2 % (ref 11.5–15.5)
Smear Review: INCREASED
WBC: 15.1 10*3/uL — ABNORMAL HIGH (ref 4.0–10.5)
nRBC: 0 % (ref 0.0–0.2)

## 2022-11-07 LAB — COMPREHENSIVE METABOLIC PANEL
ALT: 14 U/L (ref 0–44)
AST: 21 U/L (ref 15–41)
Albumin: 3 g/dL — ABNORMAL LOW (ref 3.5–5.0)
Alkaline Phosphatase: 47 U/L (ref 38–126)
Anion gap: 9 (ref 5–15)
BUN: 11 mg/dL (ref 8–23)
CO2: 25 mmol/L (ref 22–32)
Calcium: 8.2 mg/dL — ABNORMAL LOW (ref 8.9–10.3)
Chloride: 98 mmol/L (ref 98–111)
Creatinine, Ser: 0.59 mg/dL (ref 0.44–1.00)
GFR, Estimated: >60 mL/min (ref >60)
Glucose, Bld: 116 mg/dL — ABNORMAL HIGH (ref 70–99)
Potassium: 3.7 mmol/L (ref 3.5–5.1)
Sodium: 132 mmol/L — ABNORMAL LOW (ref 135–145)
Total Bilirubin: 0.4 mg/dL (ref 0.3–1.2)
Total Protein: 5.6 g/dL — ABNORMAL LOW (ref 6.5–8.1)

## 2022-11-07 LAB — URINALYSIS, ROUTINE W REFLEX MICROSCOPIC
Bilirubin Urine: NEGATIVE
Glucose, UA: NEGATIVE mg/dL
Hgb urine dipstick: NEGATIVE
Ketones, ur: NEGATIVE mg/dL
Nitrite: NEGATIVE
Protein, ur: NEGATIVE mg/dL
Specific Gravity, Urine: 1.02 (ref 1.005–1.030)
pH: 7 (ref 5.0–8.0)

## 2022-11-07 LAB — TROPONIN I (HIGH SENSITIVITY): Troponin I (High Sensitivity): <2 ng/L (ref <18)

## 2022-11-07 LAB — URINALYSIS, MICROSCOPIC (REFLEX): RBC / HPF: NONE SEEN RBC/hpf (ref 0–5)

## 2022-11-07 LAB — LIPASE, BLOOD: Lipase: 33 U/L (ref 11–51)

## 2022-11-07 MED ORDER — IOHEXOL 300 MG/ML  SOLN
100.0000 mL | Freq: Once | INTRAMUSCULAR | Status: AC | PRN
  Administered 2022-11-07: 100 mL via INTRAVENOUS

## 2022-11-07 MED ORDER — PANTOPRAZOLE SODIUM 40 MG PO TBEC: 40.0000 mg | DELAYED_RELEASE_TABLET | Freq: Every day | ORAL | 0 refills | Status: AC

## 2022-11-07 MED ORDER — DICYCLOMINE HCL 20 MG PO TABS: 20.0000 mg | ORAL_TABLET | Freq: Three times a day (TID) | ORAL | 0 refills | Status: AC | PRN

## 2022-11-07 MED ORDER — ONDANSETRON 4 MG PO TBDP: 4.0000 mg | ORAL_TABLET | Freq: Three times a day (TID) | ORAL | 0 refills | Status: AC | PRN

## 2022-11-07 NOTE — ED Provider Notes (Signed)
Emergency Department Provider Note   I have reviewed the triage vital signs and the nursing notes.   HISTORY  Chief Complaint Abdominal Pain   HPI Alicia Callahan is a 62 y.o. female past history of diabetes, hypertension, hypercholesterolemia presents emergency department with epigastric abdominal pain with constipation and some mild diarrhea.  She was actually seen at urgent care this morning with labs drawn showing leukocytosis to 16 and mild lipase elevation to 91.  She was called with these test results and advised to return to the emergency department for reevaluation.  She had an abdominal x-ray this morning which showed elevated stool burden but no evidence of obvious bowel obstruction.  She has not been vomiting.  No pain into her chest.  She does feel occasional heart palpitations but nothing severe.  No syncope.   Past Medical History:  Diagnosis Date   Acid reflux    Anxiety    Depression    Diabetes mellitus    Diabetes mellitus type II    High cholesterol    Hypertension    Irritable bowel syndrome     Review of Systems  Constitutional: No fever/chills Eyes: No visual changes. ENT: No sore throat. Cardiovascular: Denies chest pain. Respiratory: Denies shortness of breath. Gastrointestinal: Positive epigastric abdominal pain. Positive nausea, no vomiting.  Positive diarrhea. Mild constipation. Genitourinary: Negative for dysuria. Musculoskeletal: Negative for back pain. Skin: Negative for rash. Neurological: Negative for headaches.   ____________________________________________   PHYSICAL EXAM:  VITAL SIGNS: ED Triage Vitals  Encounter Vitals Group     BP 11/07/22 1724 (!) 141/68     Pulse Rate 11/07/22 1724 89     Resp 11/07/22 1724 18     Temp 11/07/22 1724 98.8 F (37.1 C)     Temp src --      SpO2 11/07/22 1724 100 %     Weight 11/07/22 1724 127 lb (57.6 kg)     Height 11/07/22 1724 5\' 2"  (1.575 m)   Constitutional: Alert and oriented.  Well appearing and in no acute distress. Eyes: Conjunctivae are normal.  Head: Atraumatic. Nose: No congestion/rhinnorhea. Mouth/Throat: Mucous membranes are moist.  Neck: No stridor.   Cardiovascular: Normal rate, regular rhythm. Good peripheral circulation. Grossly normal heart sounds.   Respiratory: Normal respiratory effort.  No retractions. Lungs CTAB. Gastrointestinal: Soft with mild mid-epigastric tenderness. No lower abdominal tenderness. No peritonitis. No distention.  Musculoskeletal: No lower extremity tenderness nor edema. No gross deformities of extremities. Neurologic:  Normal speech and language.  Skin:  Skin is warm, dry and intact. No rash noted.   ____________________________________________   LABS (all labs ordered are listed, but only abnormal results are displayed)  Labs Reviewed  COMPREHENSIVE METABOLIC PANEL - Abnormal; Notable for the following components:      Result Value   Sodium 132 (*)    Glucose, Bld 116 (*)    Calcium 8.2 (*)    Total Protein 5.6 (*)    Albumin 3.0 (*)    All other components within normal limits  CBC WITH DIFFERENTIAL/PLATELET - Abnormal; Notable for the following components:   WBC 15.1 (*)    RBC 3.64 (*)    Hemoglobin 11.0 (*)    HCT 32.6 (*)    Lymphs Abs 4.1 (*)    Eosinophils Absolute 4.0 (*)    All other components within normal limits  URINALYSIS, ROUTINE W REFLEX MICROSCOPIC - Abnormal; Notable for the following components:   Leukocytes,Ua SMALL (*)    All  other components within normal limits  URINALYSIS, MICROSCOPIC (REFLEX) - Abnormal; Notable for the following components:   Bacteria, UA RARE (*)    All other components within normal limits  LIPASE, BLOOD  PATHOLOGIST SMEAR REVIEW  TROPONIN I (HIGH SENSITIVITY)   ____________________________________________  EKG   EKG Interpretation Date/Time:  Wednesday November 07 2022 17:34:19 EDT Ventricular Rate:  89 PR Interval:  151 QRS Duration:  88 QT  Interval:  335 QTC Calculation: 408 R Axis:   -63  Text Interpretation: Sinus rhythm Consider RVH or posterior infarct Inferior infarct, old Confirmed by Alona Bene [phone]) on 11/07/2022 5:38:54 PM        ____________________________________________  RADIOLOGY  CT ABDOMEN PELVIS W CONTRAST  Result Date: 11/07/2022 CLINICAL DATA:  Acute abdominal pain, nonlocalized. EXAM: CT ABDOMEN AND PELVIS WITH CONTRAST TECHNIQUE: Multidetector CT imaging of the abdomen and pelvis was performed using the standard protocol following bolus administration of intravenous contrast. RADIATION DOSE REDUCTION: This exam was performed according to the departmental dose-optimization program which includes automated exposure control, adjustment of the mA and/or kV according to patient size and/or use of iterative reconstruction technique. CONTRAST:  OMNIPAQUE IOHEXOL 300 MG/ML  SOLN COMPARISON:  CT examination dated July 08, 2021 FINDINGS: Lower chest: No acute abnormality. Hepatobiliary: Small subcentimeter hypodense cysts in the left hepatic lobe, likely small biliary cyst. No gallstones, gallbladder wall thickening, or biliary dilatation. Pancreas: Unremarkable. No pancreatic ductal dilatation or surrounding inflammatory changes. Spleen: Normal in size without focal abnormality. Adrenals/Urinary Tract: Adrenal glands are unremarkable. Kidneys are normal, without renal calculi, focal lesion, or hydronephrosis. Bladder is unremarkable. Stomach/Bowel: Stomach is within normal limits. Appendix not identified, however no inflammatory changes in the pericecal region. No evidence of bowel wall thickening, distention, or inflammatory changes. Vascular/Lymphatic: No significant vascular findings are present. No enlarged abdominal or pelvic lymph nodes. Reproductive: Uterus and bilateral adnexa are unremarkable. Other: No abdominal wall hernia or abnormality. No abdominopelvic ascites. Musculoskeletal: Degenerate disc  disease of the lumbar spine prominent at L5-S1. No acute osseous abnormality. IMPRESSION: 1. No CT evidence of acute abdominal/pelvic process. 2. No evidence of nephrolithiasis or hydronephrosis. 3. Bowel loops are normal in caliber. No evidence of colitis or diverticulitis. 4. Uterus and adnexa are unremarkable. 5. Degenerate disc disease of the lumbar spine prominent at L5-S1. Electronically Signed   By: Larose Hires D.O.   On: 11/07/2022 19:07    ____________________________________________   PROCEDURES  Procedure(s) performed:   Procedures  None  ____________________________________________   INITIAL IMPRESSION / ASSESSMENT AND PLAN / ED COURSE  Pertinent labs & imaging results that were available during my care of the patient were reviewed by me and considered in my medical decision making (see chart for details).   This patient is Presenting for Evaluation of abdominal pain, which does require a range of treatment options, and is a complaint that involves a high risk of morbidity and mortality.  The Differential Diagnoses includes but is not exclusive to acute cholecystitis, intrathoracic causes for epigastric abdominal pain, gastritis, duodenitis, pancreatitis, small bowel or large bowel obstruction, abdominal aortic aneurysm, hernia, gastritis, etc.   Critical Interventions-    Medications  iohexol (OMNIPAQUE) 300 MG/ML solution 100 mL (100 mLs Intravenous Contrast Given 11/07/22 1841)    Reassessment after intervention:  symptoms improved.   I decided to review pertinent External Data, and in summary patient seen at [facility] this AM. Abdominal XR and labs reviewed.   Clinical Laboratory Tests Ordered, included CBC with leukocytosis  and mild anemia at 11.  LFTs, bilirubin, lipase normal.  Troponin normal.  UA without infection.  Radiologic Tests Ordered, included CT abdomen/pelvis. I independently interpreted the images and agree with radiology interpretation.   Cardiac  Monitor Tracing which shows NSR.    Social Determinants of Health Risk patient is a non-smoker.   Medical Decision Making: Summary:  Patient presents emergency department for evaluation of epigastric abdominal pain with nausea and diarrhea.  Endorses some associated constipation.  Mild epigastric tenderness without peritonitis.  Labs from earlier this morning showed leukocytosis to 16 with mild lipase elevation.  Plan for CT abdomen pelvis and reassess.  Reevaluation with update and discussion with patient. Labs and imaging are reassuring. Plan for symptom mgmt and PCP follow up. Possible gastritis vs ileus. Considered admit but advised symptom mgmt at home. Patient has follow up scheduled with GI in place.   Patient's presentation is most consistent with acute presentation with potential threat to life or bodily function.   Disposition: discharge  ____________________________________________  FINAL CLINICAL IMPRESSION(S) / ED DIAGNOSES  Final diagnoses:  Epigastric pain  Precordial chest pain     NEW OUTPATIENT MEDICATIONS STARTED DURING THIS VISIT:  Discharge Medication List as of 11/07/2022  8:43 PM     START taking these medications   Details  dicyclomine (BENTYL) 20 MG tablet Take 1 tablet (20 mg total) by mouth 3 (three) times daily as needed for spasms., Starting Wed 11/07/2022, Normal    ondansetron (ZOFRAN-ODT) 4 MG disintegrating tablet Take 1 tablet (4 mg total) by mouth every 8 (eight) hours as needed., Starting Wed 11/07/2022, Normal        Note:  This document was prepared using Dragon voice recognition software and may include unintentional dictation errors.  Alona Bene, MD, [facility] Emergency Medicine    Caitlinn Klinker, Arlyss Repress, MD 11/07/22 [phone]

## 2022-11-07 NOTE — ED Triage Notes (Signed)
Pt reports to ED with complaints of abdominal pain. States that she has had some diarrhea, constipation and that she can't eat. States that she also has had some chest discomfort. Pt also states that she went to urgent care this morning.

## 2022-11-07 NOTE — Discharge Instructions (Signed)

## 2022-11-07 NOTE — ED Notes (Signed)
Discharge paperwork reviewed entirely with patient, including follow up care. Pain was under control. The patient received instruction and coaching on their prescriptions, and all follow-up questions were answered.  Pt verbalized understanding as well as all parties involved. No questions or concerns voiced at the time of discharge. No acute distress noted.   Pt ambulated out to PVA without incident or assistance.  

## 2022-11-09 LAB — PATHOLOGIST SMEAR REVIEW

## 2022-11-28 ENCOUNTER — Emergency Department (HOSPITAL_BASED_OUTPATIENT_CLINIC_OR_DEPARTMENT_OTHER): Payer: Medicare HMO

## 2022-11-28 ENCOUNTER — Emergency Department (HOSPITAL_BASED_OUTPATIENT_CLINIC_OR_DEPARTMENT_OTHER)
Admission: EM | Admit: 2022-11-28 | Discharge: 2022-11-28 | Disposition: A | Discharge status: Home / Self Care | Payer: Medicare HMO | Attending: Emergency Medicine | Admitting: Emergency Medicine

## 2022-11-28 ENCOUNTER — Other Ambulatory Visit: Payer: Self-pay

## 2022-11-28 DIAGNOSIS — Z7984 Long term (current) use of oral hypoglycemic drugs: Secondary | ICD-10-CM | POA: Insufficient documentation

## 2022-11-28 DIAGNOSIS — Z79899 Other long term (current) drug therapy: Secondary | ICD-10-CM | POA: Diagnosis not present

## 2022-11-28 DIAGNOSIS — R1013 Epigastric pain: Secondary | ICD-10-CM | POA: Diagnosis present

## 2022-11-28 DIAGNOSIS — K921 Melena: Secondary | ICD-10-CM | POA: Diagnosis not present

## 2022-11-28 DIAGNOSIS — I10 Essential (primary) hypertension: Secondary | ICD-10-CM | POA: Diagnosis not present

## 2022-11-28 DIAGNOSIS — E119 Type 2 diabetes mellitus without complications: Secondary | ICD-10-CM | POA: Diagnosis not present

## 2022-11-28 DIAGNOSIS — R195 Other fecal abnormalities: Secondary | ICD-10-CM

## 2022-11-28 LAB — COMPREHENSIVE METABOLIC PANEL
ALT: 14 U/L (ref 0–44)
AST: 18 U/L (ref 15–41)
Albumin: 3.5 g/dL (ref 3.5–5.0)
Alkaline Phosphatase: 48 U/L (ref 38–126)
Anion gap: 12 (ref 5–15)
BUN: 13 mg/dL (ref 8–23)
CO2: 28 mmol/L (ref 22–32)
Calcium: 9.4 mg/dL (ref 8.9–10.3)
Chloride: 100 mmol/L (ref 98–111)
Creatinine, Ser: 0.7 mg/dL (ref 0.44–1.00)
GFR, Estimated: >60 mL/min (ref >60)
Glucose, Bld: 169 mg/dL — ABNORMAL HIGH (ref 70–99)
Potassium: 4 mmol/L (ref 3.5–5.1)
Sodium: 140 mmol/L (ref 135–145)
Total Bilirubin: 0.6 mg/dL (ref 0.3–1.2)
Total Protein: 6.1 g/dL — ABNORMAL LOW (ref 6.5–8.1)

## 2022-11-28 LAB — URINALYSIS, MICROSCOPIC (REFLEX)

## 2022-11-28 LAB — URINALYSIS, ROUTINE W REFLEX MICROSCOPIC
Bilirubin Urine: NEGATIVE
Glucose, UA: NEGATIVE mg/dL
Hgb urine dipstick: NEGATIVE
Ketones, ur: NEGATIVE mg/dL
Nitrite: NEGATIVE
Protein, ur: NEGATIVE mg/dL
Specific Gravity, Urine: 1.025 (ref 1.005–1.030)
pH: 7.5 (ref 5.0–8.0)

## 2022-11-28 LAB — CBC
HCT: 36.3 % (ref 36.0–46.0)
Hemoglobin: 11.9 g/dL — ABNORMAL LOW (ref 12.0–15.0)
MCH: 30.7 pg (ref 26.0–34.0)
MCHC: 32.8 g/dL (ref 30.0–36.0)
MCV: 93.6 fL (ref 80.0–100.0)
Platelets: 398 10*3/uL (ref 150–400)
RBC: 3.88 MIL/uL (ref 3.87–5.11)
RDW: 13.4 % (ref 11.5–15.5)
WBC: 9.5 10*3/uL (ref 4.0–10.5)
nRBC: 0 % (ref 0.0–0.2)

## 2022-11-28 LAB — TROPONIN I (HIGH SENSITIVITY): Troponin I (High Sensitivity): <2 ng/L (ref <18)

## 2022-11-28 LAB — LIPASE, BLOOD: Lipase: 39 U/L (ref 11–51)

## 2022-11-28 LAB — OCCULT BLOOD X 1 CARD TO LAB, STOOL: Fecal Occult Bld: NEGATIVE

## 2022-11-28 NOTE — ED Triage Notes (Addendum)
Patient presents to ED via POV from home. Here with abdominal pain. Pressured speech noted. Reports GI appointment at [facility] next week.

## 2022-11-28 NOTE — ED Provider Notes (Signed)
[place] EMERGENCY DEPARTMENT AT [facility] Provider Note   CSN: [id number] Arrival date & time: 11/28/22  1449     History Chief Complaint  Patient presents with   Abdominal Pain    Alicia Callahan is a 62 y.o. female with history of anxiety, acid reflux, type 2 diabetes, hypertension, high cholesterol, irritable bowel syndrome presents emerged from today for evaluation of persistent intermittent epigastric pain for over a month.  She also reports that she will have some occasional diarrhea/soft stools around 1-2 episodes per day.  She reports that she is already talked to her GI provider about this and has had studies done of her stool which was unremarkable however they did not get a C. difficile testing.  She reports that the past week that she noticed that her stool was black and tarry.  Additionally, she does mention that she has been taking Pepto-Bismol this week as well and is not taking it previously.  She denies any lower abdominal pain.  She describes it as a dull sensation.  She was recently treated with fosfomycin for an urinary tract infection.  She reports that she has been trying to do a bland diet like her GI provider discussed however she has been "cheating" because she has been hungry and does not feel satisfied on the foods and ate a cheeseburger yesterday in addition to other nonbland foods throughout this past month..  She reports that she felt fine at night but had a little pain in the morning.  She reports she may have some occasional nausea, but rare.  No vomiting.  She denies any dysuria or hematuria currently.  Denies any fever.  Denies any recent travel.  She was seen for lab work today that showed her hemoglobin from 12.8-12.1 in addition to the darker stools, she was encouraged to come into the ER for rule out of blood in her stool.  She denies any worsening or pain but reports it is just not improving any.  She does have an appointment with her GI specialist  on Monday.   Abdominal Pain Associated symptoms: diarrhea   Associated symptoms: no chest pain, no chills, no constipation, no dysuria, no fever, no hematuria, no shortness of breath and no vomiting        Home Medications Prior to Admission medications   Medication Sig Start Date End Date Taking? Authorizing Provider  Acetaminophen-Caffeine (TENSION HEADACHE) 500-65 MG TABS Take 2 tablets by mouth every 4 (four) hours as needed (pain).    [provider]  buPROPion (WELLBUTRIN) 100 MG tablet Take 1 tablet (100 mg total) by mouth 2 (two) times daily. Patient taking differently: Take 100 mg by mouth daily.  08/23/11 01/14/18  Gayland Curry, MD  clonazePAM (KLONOPIN) 1 MG tablet Take 1 mg by mouth 2 (two) times daily as needed for anxiety.     [provider]  dicyclomine (BENTYL) 20 MG tablet Take 1 tablet (20 mg total) by mouth 3 (three) times daily as needed for spasms. 11/07/22   Long, Arlyss Repress, MD  Escitalopram Oxalate (LEXAPRO PO) Take 20 mg by mouth daily.     [provider]  gabapentin (NEURONTIN) 100 MG capsule Take 1-3 capsules by mouth at bedtime as needed. pain 08/02/14   [provider]  Ginkgo Biloba Extract (GNP GINGKO BILOBA EXTRACT) 60 MG CAPS Take 1 capsule by mouth daily.    [provider]  HYDROcodone-acetaminophen (NORCO/VICODIN) 5-325 MG per tablet Take 1 tablet by mouth  every 8 (eight) hours as needed. pain 10/26/14   [provider]  hyoscyamine (ANASPAZ) 0.125 MG TBDP Place 0.125 mg under the tongue.    [provider]  lisinopril (PRINIVIL,ZESTRIL) 5 MG tablet Take 5 mg by mouth daily. 10/04/14 01/14/18  [provider]  metFORMIN (GLUCOPHAGE) 500 MG tablet Take 1 tablet by mouth 2 (two) times daily. 06/21/14   [provider]  metoprolol succinate (TOPROL-XL) 50 MG 24 hr tablet Take 1 tablet by mouth every morning. 10/18/14   [provider]  ondansetron (ZOFRAN-ODT) 4 MG  disintegrating tablet Take 1 tablet (4 mg total) by mouth every 8 (eight) hours as needed. 11/07/22   Long, Arlyss Repress, MD  pantoprazole (PROTONIX) 40 MG tablet Take 1 tablet (40 mg total) by mouth daily. 11/07/22   Long, Arlyss Repress, MD  potassium chloride SA (KLOR-CON M) 20 MEQ tablet Take 1 tablet (20 mEq total) by mouth 2 (two) times daily for 3 days. 07/08/21 07/11/21  Roemhildt, Lorin T, PA-C  simvastatin (ZOCOR) 10 MG tablet Take 10 mg by mouth at bedtime. 10/06/14 01/14/18  [provider]  vitamin E 100 UNIT capsule Take 100 Units by mouth daily.    [provider]  zolpidem (AMBIEN) 5 MG tablet Take 5 mg by mouth at bedtime as needed for sleep.     [provider]      Allergies    Sulphadimidine [sulfamethazine] and Sulphur [elemental sulfur]    Review of Systems   Review of Systems  Constitutional:  Negative for chills and fever.  Respiratory:  Negative for shortness of breath.   Cardiovascular:  Negative for chest pain.  Gastrointestinal:  Positive for abdominal pain and diarrhea. Negative for blood in stool, constipation and vomiting.  Genitourinary:  Negative for dysuria and hematuria.  Musculoskeletal:  Negative for joint swelling.    Physical Exam Updated Vital Signs BP 130/64 (BP Location: Left Arm)   Pulse 84   Temp 98.6 F (37 C)   Resp 18   Ht 5\' 2"  (1.575 m)   Wt 56.2 kg   SpO2 100%   BMI 22.68 kg/m  Physical Exam Vitals and nursing note reviewed.  Constitutional:      General: She is not in acute distress.    Appearance: She is not ill-appearing or toxic-appearing.     Comments: On phone in no acute distress  HENT:     Mouth/Throat:     Mouth: Mucous membranes are moist.  Cardiovascular:     Rate and Rhythm: Normal rate.  Pulmonary:     Effort: Pulmonary effort is normal. No respiratory distress.  Abdominal:     General: There is no distension.     Palpations: Abdomen is soft.     Tenderness: There is abdominal tenderness in the  right upper quadrant and epigastric area. There is no right CVA tenderness or left CVA tenderness.  Skin:    General: Skin is warm and dry.  Neurological:     Mental Status: She is alert.     ED Results / Procedures / Treatments   Labs (all labs ordered are listed, but only abnormal results are displayed) Labs Reviewed  COMPREHENSIVE METABOLIC PANEL - Abnormal; Notable for the following components:      Result Value   Glucose, Bld 169 (*)    Total Protein 6.1 (*)    All other components within normal limits  CBC - Abnormal; Notable for the following components:   Hemoglobin 11.9 (*)  All other components within normal limits  URINALYSIS, ROUTINE W REFLEX MICROSCOPIC - Abnormal; Notable for the following components:   Leukocytes,Ua TRACE (*)    All other components within normal limits  URINALYSIS, MICROSCOPIC (REFLEX) - Abnormal; Notable for the following components:   Bacteria, UA RARE (*)    All other components within normal limits  C DIFFICILE QUICK SCREEN W PCR REFLEX    LIPASE, BLOOD  OCCULT BLOOD X 1 CARD TO LAB, STOOL  TROPONIN I (HIGH SENSITIVITY)    EKG None  Radiology US Abdomen Limited RUQ (LIVER/GB)  Result Date: 11/28/2022 CLINICAL DATA:  Epigastric pain EXAM: ULTRASOUND ABDOMEN LIMITED RIGHT UPPER QUADRANT COMPARISON:  CT abdomen/pelvis dated 11/07/2022 FINDINGS: Gallbladder: Gallbladder is contracted. No gallstones, gallbladder wall thickening, or pericholecystic fluid. Negative sonographic Murphy's sign. Common bile duct: Diameter: 5 mm Liver: 14 x 8 x 13 mm right hepatic lobe cyst, benign. Portal vein is patent on color Doppler imaging with normal direction of blood flow towards the liver. Other: None. IMPRESSION: 14 mm right hepatic lobe cysts, benign. Otherwise negative right upper quadrant ultrasound. Electronically Signed   By: Charline Bills M.D.   On: 11/28/2022 18:58    Procedures Procedures   Medications Ordered in ED Medications - No data to  display  ED Course/ Medical Decision Making/ A&P                               Medical Decision Making Amount and/or Complexity of Data Reviewed Labs: ordered. Radiology: ordered.   62 y.o. female presents to the ER for evaluation of abdominal pain with black stool. Differential diagnosis includes but is not limited to Biliary colic, cholecystitis, hepatitis (viral, alcoholic, toxic), appendicitis, GERD/PUD, pancreatitis, malignancy, hepatic ischemia, right lower lobe pneumonia, pyelonephritis, urinary calculi, UTI, herpes zoster, musculoskeletal pain, herniated disk, intestinal ischemia, IBD, GI bleed, medication reaction. Vital signs unremarkable. Physical exam as noted above.   I independently reviewed and interpreted the patient's labs.  Urinalysis shows trace amount of leukocytes and rare bacteria.  Patient just received fosfomycin yesterday.  Currently being treated for UTI.  CMP shows glucose at 169 with a decrease in protein at 6.1 otherwise no other electrolyte or LFT abnormality.  Patient hyperglycemic in the setting of type 2 diabetes but no signs of DKA.  CBC shows a hemoglobin 11.9 with no leukocytosis.  Normal platelets.  It appears her previous hemoglobin here 3 weeks ago was 11.0.  With [facility], she reports that her hemoglobins were 12.8-12.1 which is why she was sent over here given the drop in her hemoglobin. Her lipase within normal limits.  Troponin less than 2.  Fecal occult is negative.  Patient was unable to provide a stool sample here for C. difficile testing.  On previous chart evaluation, patient was seen for this issue at the Emergency Department on 11-07-2022.  She also had orders done with the gastro office for stool testing.  She tested negative for bacteria, parasites, or viral testing.  Her drop in her hemoglobin has been less than a gram.  She has been in the low 12's and upper 13's before.  Appears that her blood count does fluctuate.  EKGs show sinus rhythm.  Given  her pain is mainly in her right upper quadrant and epigastric region, I do not see reason to order a CT image at this time.  Right upper quadrant ultrasound shows  14 mm right hepatic lobe cysts, benign.  Otherwise negative right upper quadrant ultrasound.  I had a shared decision-making with the patient about CT imaging.  We discussed the risk and benefits.  She did have CT imaging done in July and has had the same pain without any worsening.  She would like to forego at this time.  I think this is reasonable.  At this time, patient's hemoglobin per our records recently has actually improved from 11-11.9.  Her Hemoccult is negative.  Her black stools are likely from her recent use of Pepto-Bismol this week.  She reports that she has been doing a planned diet but has been cheating on it because she has been hungry and then is having some abdominal pain the next day.  I discussed with her that she will likely need to follow-up with her GI provider on Monday at her established appointment.  I advised her to continue with a bland diet and to make sure that she is staying well-hydrated.  Encouraged her to continue taking her PPI and Bentyl.  At this time, I do not see need for admission.  This abdominal pain has been going on for over a month and has been well-connected with GI during this time.  She has been eating and has been tolerating p.o.  Her vital signs have been stable.  She does not appear in any acute distress.  Her abdomen is soft and nontender.  I have a lower decision for any small bowel obstruction.  If she does not have pain after eating, lower suspicion for any bowel ischemia.  She has had recent stool testing which has been negative for parasites and bacteria.  It was only not tested for C. difficile which we can to do today however patient was unable to provide a stool sample.  She already has follow-up with her GI doctor on Monday which she continue to follow-up with for reevaluation of this  issue.  At this time, will discharge home.  We discussed the results of the labs/imaging. The plan is . We discussed strict return precautions and red flag symptoms. The patient verbalized their understanding and agrees to the plan. The patient is stable and being discharged home in good condition.  Portions of this report may have been transcribed using voice recognition software. Every effort was made to ensure accuracy; however, inadvertent computerized transcription errors may be present.   Final Clinical Impression(s) / ED Diagnoses Final diagnoses:  Epigastric pain  Dark stools    Rx / DC Orders ED Discharge Orders     None         Achille Rich, PA-C 11/28/22 1939    Franne Forts, DO 11/28/22 2347

## 2022-11-28 NOTE — Discharge Instructions (Addendum)
You were seen in the emergency room today for evaluation of your abdominal pain with your darker stools.  Your stool tested negative for blood.  The darkness was likely from the Pepto-Bismol you are taking.  Additionally, your right upper quadrant ultrasound did show you had some hepatic cysts which are likely benign.  I would like for you to follow-up with your GI provider about your symptoms.  Please continue taking your medications as prescribed.  If you have any concerns, new or worsening symptoms, please return to your nearest emergency department for reevaluation.  Contact a doctor if: Your belly pain changes or gets worse. You have very bad cramping or bloating in your belly. You vomit. Your pain gets worse with meals, after eating, or with certain foods. You have trouble pooping or have watery poop for more than 2-3 days. You are not hungry, or you lose weight without trying. You have signs of not getting enough fluid or water (dehydration). These may include: Dark pee, very little pee, or no pee. Cracked lips or dry mouth. Feeling sleepy or weak. You have pain when you pee or poop. Your belly pain wakes you up at night. You have blood in your pee. You have a fever. Get help right away if: You cannot stop vomiting. Your pain is only in one part of your belly, like on the right side. You have bloody or black poop, or poop that looks like tar. You have trouble breathing. You have chest pain. These symptoms may be an emergency. Get help right away. Call 911. Do not wait to see if the symptoms will go away. Do not drive yourself to the hospital.

## 2022-12-11 ENCOUNTER — Ambulatory Visit: Payer: Medicare HMO | Admitting: Cardiology

## 2023-10-07 ENCOUNTER — Encounter (HOSPITAL_BASED_OUTPATIENT_CLINIC_OR_DEPARTMENT_OTHER): Payer: Self-pay | Admitting: Emergency Medicine

## 2023-10-07 ENCOUNTER — Emergency Department (HOSPITAL_BASED_OUTPATIENT_CLINIC_OR_DEPARTMENT_OTHER)

## 2023-10-07 ENCOUNTER — Other Ambulatory Visit: Payer: Self-pay

## 2023-10-07 ENCOUNTER — Emergency Department (HOSPITAL_BASED_OUTPATIENT_CLINIC_OR_DEPARTMENT_OTHER)
Admission: EM | Admit: 2023-10-07 | Discharge: 2023-10-07 | Disposition: A | Discharge status: Home / Self Care | Attending: Emergency Medicine | Admitting: Emergency Medicine

## 2023-10-07 DIAGNOSIS — I10 Essential (primary) hypertension: Secondary | ICD-10-CM | POA: Diagnosis not present

## 2023-10-07 DIAGNOSIS — R109 Unspecified abdominal pain: Secondary | ICD-10-CM | POA: Insufficient documentation

## 2023-10-07 DIAGNOSIS — K760 Fatty (change of) liver, not elsewhere classified: Secondary | ICD-10-CM | POA: Diagnosis not present

## 2023-10-07 DIAGNOSIS — X58XXXA Exposure to other specified factors, initial encounter: Secondary | ICD-10-CM | POA: Insufficient documentation

## 2023-10-07 DIAGNOSIS — Z79899 Other long term (current) drug therapy: Secondary | ICD-10-CM | POA: Diagnosis not present

## 2023-10-07 DIAGNOSIS — Z7984 Long term (current) use of oral hypoglycemic drugs: Secondary | ICD-10-CM | POA: Diagnosis not present

## 2023-10-07 DIAGNOSIS — S32030A Wedge compression fracture of third lumbar vertebra, initial encounter for closed fracture: Secondary | ICD-10-CM | POA: Insufficient documentation

## 2023-10-07 DIAGNOSIS — M545 Low back pain, unspecified: Secondary | ICD-10-CM | POA: Diagnosis present

## 2023-10-07 DIAGNOSIS — E119 Type 2 diabetes mellitus without complications: Secondary | ICD-10-CM | POA: Diagnosis not present

## 2023-10-07 LAB — URINALYSIS, ROUTINE W REFLEX MICROSCOPIC
Bilirubin Urine: NEGATIVE
Glucose, UA: NEGATIVE mg/dL
Hgb urine dipstick: NEGATIVE
Ketones, ur: NEGATIVE mg/dL
Leukocytes,Ua: NEGATIVE
Nitrite: NEGATIVE
Protein, ur: NEGATIVE mg/dL
Specific Gravity, Urine: 1.015 (ref 1.005–1.030)
pH: 6.5 (ref 5.0–8.0)

## 2023-10-07 LAB — CBC
HCT: 33.7 % — ABNORMAL LOW (ref 36.0–46.0)
Hemoglobin: 11.6 g/dL — ABNORMAL LOW (ref 12.0–15.0)
MCH: 30.1 pg (ref 26.0–34.0)
MCHC: 34.4 g/dL (ref 30.0–36.0)
MCV: 87.5 fL (ref 80.0–100.0)
Platelets: 487 10*3/uL — ABNORMAL HIGH (ref 150–400)
RBC: 3.85 MIL/uL — ABNORMAL LOW (ref 3.87–5.11)
RDW: 13.4 % (ref 11.5–15.5)
WBC: 11.6 10*3/uL — ABNORMAL HIGH (ref 4.0–10.5)
nRBC: 0 % (ref 0.0–0.2)

## 2023-10-07 LAB — COMPREHENSIVE METABOLIC PANEL WITH GFR
ALT: 12 U/L (ref 0–44)
AST: 12 U/L — ABNORMAL LOW (ref 15–41)
Albumin: 3.6 g/dL (ref 3.5–5.0)
Alkaline Phosphatase: 77 U/L (ref 38–126)
Anion gap: 10 (ref 5–15)
BUN: 15 mg/dL (ref 8–23)
CO2: 28 mmol/L (ref 22–32)
Calcium: 9.5 mg/dL (ref 8.9–10.3)
Chloride: 91 mmol/L — ABNORMAL LOW (ref 98–111)
Creatinine, Ser: 0.66 mg/dL (ref 0.44–1.00)
GFR, Estimated: >60 mL/min (ref >60)
Glucose, Bld: 148 mg/dL — ABNORMAL HIGH (ref 70–99)
Potassium: 3.9 mmol/L (ref 3.5–5.1)
Sodium: 129 mmol/L — ABNORMAL LOW (ref 135–145)
Total Bilirubin: 0.2 mg/dL (ref 0.0–1.2)
Total Protein: 5.7 g/dL — ABNORMAL LOW (ref 6.5–8.1)

## 2023-10-07 LAB — LIPASE, BLOOD: Lipase: 25 U/L (ref 11–51)

## 2023-10-07 MED ORDER — OXYCODONE HCL 5 MG PO TABS: 2.5000 mg | ORAL_TABLET | Freq: Four times a day (QID) | ORAL | 0 refills | Status: AC | PRN

## 2023-10-07 MED ORDER — METHOCARBAMOL 500 MG PO TABS: 500.0000 mg | ORAL_TABLET | Freq: Three times a day (TID) | ORAL | 0 refills | Status: AC | PRN

## 2023-10-07 MED ORDER — IOHEXOL 300 MG/ML  SOLN
100.0000 mL | Freq: Once | INTRAMUSCULAR | Status: AC | PRN
  Administered 2023-10-07: 100 mL via INTRAVENOUS

## 2023-10-07 MED ORDER — KETOROLAC TROMETHAMINE 30 MG/ML IJ SOLN
15.0000 mg | Freq: Once | INTRAMUSCULAR | Status: AC
  Administered 2023-10-07: 15 mg via INTRAVENOUS
  Filled 2023-10-07: qty 1

## 2023-10-07 MED ORDER — CELECOXIB 200 MG PO CAPS
200.0000 mg | ORAL_CAPSULE | Freq: Two times a day (BID) | ORAL | 0 refills | Status: AC
Start: 2023-10-07 — End: ?

## 2023-10-07 MED ORDER — SODIUM CHLORIDE 0.9 % IV BOLUS: 1000.0000 mL | Freq: Once | INTRAVENOUS | Status: AC

## 2023-10-07 NOTE — ED Notes (Signed)
 Called Ortho for back brace at 5:30

## 2023-10-07 NOTE — Discharge Instructions (Addendum)
 Lumbar Compression Fracture     A lumbar compression fracture is a break in one of the bones (vertebrae) in the lower back. This type of fracture is most common in older adults, especially those with osteoporosis, a condition that weakens bones and makes them more likely to break.[1][2][3]      **Symptoms:**        - Sudden, sharp back pain, often after a minor fall, lifting, or even coughing or sneezing        - Pain that gets worse with movement and improves with rest        - Sometimes, loss of height or a curved back (kyphosis)        - Many people do not have symptoms and the fracture is found on an X-ray done for another reason[1][2][3]      **Diagnosis:**        - Usually starts with an X-ray of the spine        - Sometimes, a CT scan or MRI is needed to see if the fracture is new or old, or to check for other causes of back pain[1][4][5]      **Treatment:**        Most lumbar compression fractures heal with time and do not need surgery. The main goals are to control pain, help you move safely, and prevent future fractures.      - **Pain relief:**        - Over-the-counter medicines like acetaminophen  (Tylenol ) or nonsteroidal anti-inflammatory drugs (NSAIDs) can help  [2][6]      - In some cases, a medicine called calcitonin may be used for short-term pain relief  [6]      - Opioid pain medicines may be used for severe pain, but only for a short time due to side effects, especially in older adults[2][7]      - **Activity and rest:**        - Short periods of rest may help, but staying in bed too long can weaken muscles and bones  [8][9]      - Gentle movement and walking are encouraged as soon as possible      - **Bracing:**        - A back brace may be recommended to support your spine and reduce pain while you heal[2][4][9]      - **Physical therapy:**        - Exercises to strengthen your back and improve movement can help you recover and lower the risk of  future falls[3][4][10][9]      - **Treating osteoporosis:**        - Your doctor may recommend calcium, vitamin D, and medicines to strengthen your bones and prevent more fractures[3][8][5]      - **Procedures:**        - In rare cases, if pain is severe and does not improve, procedures like vertebroplasty or kyphoplasty may be considered. These involve injecting cement into the broken bone to stabilize it. The Society of NeuroInterventional Surgery states these are options for select patients who do not get better with other treatments[8][11]      **Prevention:**        - Treating osteoporosis and staying active are the best ways to prevent more fractures        - Avoid smoking and limit alcohol, as these can weaken bones        - Make your home safer to prevent falls      Most  people with a lumbar compression fracture feel better within a few weeks to months. Let your doctor know if you have new symptoms like numbness, weakness, or trouble controlling your bladder or bowels, as these may be signs of a more serious problem.  Return to the ER for the following reasons:  You have difficulty breathing. Your pain is very bad and it suddenly gets worse. You have numbness, tingling, or weakness in any part of your body. You are unable to empty your bladder. You cannot control when you urinate or have bowel movements. You are unable to move any body part that is below the level of your injury. You have pain in your abdomen or uncontrolled vomiting. You have a warm, tender swelling in your leg. These symptoms may be an emergency. Get help right away. Call 911. Do not wait to see if the symptoms will go away. Do not drive yourself to the hospital.

## 2023-10-07 NOTE — ED Provider Notes (Signed)
 [place] [facility] AT [facility] Provider Note   CSN: [id number] Arrival date & time: 10/07/23  1217     Patient presents with: Back Pain and Abdominal Pain   Alicia Callahan is a 63 y.o. female who is emergency department for evaluation of abdominal pain and back pain.  Patient is currently in physical therapy for deconditioning after prolonged hospital stay back in March of this year for pneumonia.  She states that last week she moved a couch and about a day after she began having severe low back pain which is constant, located in the lower back worse on the left it radiates into the anterior thighs bilaterally she has intermittent sharp pain that radiates down the front of her legs.  She denies any new weakness, saddle anesthesia, sensory changes, bowel or bladder incontinence.  She has a history of chronic urinary infections and is completing a course of Macrobid.  She is complaining of associated cramping in her abdomen which she states is like period cramps.  Patient denies fevers chills leg weakness and is ambulatory here in the emergency department.  She is already currently on a steroid pack.  {Add pertinent medical, surgical, social history, OB history to HPI:32947}  Back Pain Associated symptoms: abdominal pain   Abdominal Pain      Prior to Admission medications   Medication Sig Start Date End Date Taking? Authorizing Provider  Acetaminophen -Caffeine (TENSION HEADACHE) 500-65 MG TABS Take 2 tablets by mouth every 4 (four) hours as needed (pain).    [provider]  buPROPion  (WELLBUTRIN ) 100 MG tablet Take 1 tablet (100 mg total) by mouth 2 (two) times daily. Patient taking differently: Take 100 mg by mouth daily.  08/23/11 01/14/18  Tadepalli, Gayathri D, MD  clonazePAM (KLONOPIN) 1 MG tablet Take 1 mg by mouth 2 (two) times daily as needed for anxiety.     [provider]  dicyclomine  (BENTYL ) 20 MG tablet Take 1 tablet (20 mg total)  by mouth 3 (three) times daily as needed for spasms. 11/07/22   Long, Joshua G, MD  Escitalopram Oxalate (LEXAPRO PO) Take 20 mg by mouth daily.     [provider]  gabapentin (NEURONTIN) 100 MG capsule Take 1-3 capsules by mouth at bedtime as needed. pain 08/02/14   [provider]  Ginkgo Biloba Extract (GNP GINGKO BILOBA EXTRACT) 60 MG CAPS Take 1 capsule by mouth daily.    [provider]  HYDROcodone-acetaminophen  (NORCO/VICODIN) 5-325 MG per tablet Take 1 tablet by mouth every 8 (eight) hours as needed. pain 10/26/14   [provider]  hyoscyamine (ANASPAZ) 0.125 MG TBDP Place 0.125 mg under the tongue.    [provider]  lisinopril (PRINIVIL,ZESTRIL) 5 MG tablet Take 5 mg by mouth daily. 10/04/14 01/14/18  [provider]  metFORMIN (GLUCOPHAGE) 500 MG tablet Take 1 tablet by mouth 2 (two) times daily. 06/21/14   [provider]  metoprolol succinate (TOPROL-XL) 50 MG 24 hr tablet Take 1 tablet by mouth every morning. 10/18/14   [provider]  ondansetron  (ZOFRAN -ODT) 4 MG disintegrating tablet Take 1 tablet (4 mg total) by mouth every 8 (eight) hours as needed. 11/07/22   Long, Shereen Dike, MD  pantoprazole  (PROTONIX ) 40 MG tablet Take 1 tablet (40 mg total) by mouth daily. 11/07/22   Long, Joshua G, MD  potassium chloride  SA (KLOR-CON  M) 20 MEQ tablet Take 1 tablet (20 mEq total) by mouth 2 (two) times daily for 3 days. 07/08/21  07/11/21  Roemhildt, Lorin T, PA-C  simvastatin (ZOCOR) 10 MG tablet Take 10 mg by mouth at bedtime. 10/06/14 01/14/18  [provider]  vitamin E 100 UNIT capsule Take 100 Units by mouth daily.    [provider]  zolpidem (AMBIEN) 5 MG tablet Take 5 mg by mouth at bedtime as needed for sleep.     [provider]    Allergies: Sulphadimidine [sulfamethazine] and Sulphur [elemental sulfur]    Review of Systems  Gastrointestinal:  Positive for abdominal pain.  Musculoskeletal:   Positive for back pain.    Updated Vital Signs BP (!) 120/58   Pulse 72   Temp 98.2 F (36.8 C)   Resp 18   Ht 5' 2 (1.575 m)   Wt 56.7 kg   SpO2 100%   BMI 22.86 kg/m   Physical Exam Vitals and nursing note reviewed.  Constitutional:      General: She is not in acute distress.    Appearance: She is well-developed. She is not diaphoretic.  HENT:     Head: Normocephalic and atraumatic.     Right Ear: External ear normal.     Left Ear: External ear normal.     Nose: Nose normal.     Mouth/Throat:     Mouth: Mucous membranes are moist.   Eyes:     General: No scleral icterus.    Conjunctiva/sclera: Conjunctivae normal.    Cardiovascular:     Rate and Rhythm: Normal rate and regular rhythm.     Heart sounds: Normal heart sounds. No murmur heard.    No friction rub. No gallop.  Pulmonary:     Effort: Pulmonary effort is normal. No respiratory distress.     Breath sounds: Normal breath sounds.  Abdominal:     General: Bowel sounds are normal. There is no distension.     Palpations: Abdomen is soft. There is no mass.     Tenderness: There is abdominal tenderness in the suprapubic area. There is no guarding.   Musculoskeletal:     Cervical back: Normal range of motion.     Comments: Thoracic and lumbosacral spine area reveals no midline tenderness and no spasm.  Painful and reduced LS ROM noted. Straight leg raise is negative  bilaterally. DTR's, motor strength and sensation normal/gait is antalgic peripheral pulses are palpable.    Skin:    General: Skin is warm and dry.   Neurological:     Mental Status: She is alert and oriented to person, place, and time.   Psychiatric:        Behavior: Behavior normal.     (all labs ordered are listed, but only abnormal results are displayed) Labs Reviewed  COMPREHENSIVE METABOLIC PANEL WITH GFR - Abnormal; Notable for the following components:      Result Value   Sodium 129 (*)    Chloride 91 (*)    Glucose, Bld 148  (*)    Total Protein 5.7 (*)    AST 12 (*)    All other components within normal limits  CBC - Abnormal; Notable for the following components:   WBC 11.6 (*)    RBC 3.85 (*)    Hemoglobin 11.6 (*)    HCT 33.7 (*)    Platelets 487 (*)    All other components within normal limits  LIPASE, BLOOD  URINALYSIS, ROUTINE W REFLEX MICROSCOPIC    EKG: EKG Interpretation Date/Time:  Monday October 07 2023 12:29:10 EDT Ventricular Rate:  69 PR  Interval:  160 QRS Duration:  84 QT Interval:  347 QTC Calculation: 372 R Axis:   82  Text Interpretation: Sinus rhythm Borderline right axis deviation Probable anteroseptal infarct, old No significant change since prior 8/24 Confirmed by Racheal Buddle [phone]) on 10/07/2023 2:57:15 PM  Radiology: No results found.  {Document cardiac monitor, telemetry assessment procedure when appropriate:32947} Procedures   Medications Ordered in the ED - No data to display  Clinical Course as of 10/07/23 1513  Mon Oct 07, 2023  1511 Sodium(!): 129 [AH]  1511 Glucose(!): 148 [AH]  1511 WBC(!): 11.6 [AH]    Clinical Course User Index [AH] Tama Fails, PA-C   {Click here for ABCD2, HEART and other calculators REFRESH Note before signing:1}                              Medical Decision Making Amount and/or Complexity of Data Reviewed Labs: ordered. Decision-making details documented in ED Course. Radiology: ordered.  Risk Prescription drug management.   Alicia Callahan is a 63 year old female who presents emergency department chief complaint of abdominal pain and back pain.  Differential diagnosis includes urinary tract infection, pyelonephritis, discitis, abdominal mass, gas pain, constipation.    {Document critical care time when appropriate  Document review of labs and clinical decision tools ie CHADS2VASC2, etc  Document your independent review of radiology images and any outside records  Document your discussion with family members,  caretakers and with consultants  Document social determinants of health affecting pt's care  Document your decision making why or why not admission, treatments were needed:32947:::1}   Final diagnoses:  None    ED Discharge Orders     None

## 2023-10-07 NOTE — Progress Notes (Signed)
 Orthopedic Tech Progress Note Patient Details:  Alicia Callahan [birth year] [id number]  Called in order to [facility] for a LSO BRACE    Patient ID: Alicia Callahan, female   DOB: [birth year], 63 y.o.   MRN: [id number]  Alicia Callahan 10/07/2023, 5:35 PM

## 2023-10-07 NOTE — ED Triage Notes (Signed)
 Pt POV c/o mid and lower back pain that radiates down legs, also c/o abd pain x5 days. Denies n/v.  Reports diarrhea after taking medication to relieve constipation. Denies fever.  Reports normal PO intake.  Recently tx for UTI. Reports hx of recurrent uti.  Took ibuprofen early this AM, no relief.

## 2023-11-04 ENCOUNTER — Telehealth: Payer: Self-pay

## 2023-11-04 NOTE — Telephone Encounter (Signed)
 Copied from CRM [id number]. Topic: Referral - Question >> Nov 01, 2023  2:25 PM Winona R wrote: Pt was a previous pt of Dr. Aletha and would like to know if she can recommend a Osteoporosis dr to her. I did advise the pt Ill be glad to get her establish with the provider but she insisted that Dr. Cynda knows her. I also informed the pt we required appointments to obtain a referral however she stated she just wants a recommendation for a Osteoporosis dr within [facility] health and she will call at a later date. I also informed the pt there is no guarantee that she would get a response as she's not an established pt and not interested in scheduling

## 2024-01-05 ENCOUNTER — Ambulatory Visit: Payer: Self-pay
